# Patient Record
Sex: Female | Born: 1945 | Race: White | Hispanic: No | Marital: Married | Smoking: Current every day smoker
Health system: Southern US, Community
[De-identification: ages and names within clinical notes are randomized; demographics above are authoritative.]

## PROBLEM LIST (undated history)

## (undated) DIAGNOSIS — N390 Urinary tract infection, site not specified: Secondary | ICD-10-CM

## (undated) DIAGNOSIS — I219 Acute myocardial infarction, unspecified: Secondary | ICD-10-CM

## (undated) DIAGNOSIS — R32 Unspecified urinary incontinence: Secondary | ICD-10-CM

## (undated) HISTORY — DX: Unspecified urinary incontinence: R32

## (undated) HISTORY — DX: Urinary tract infection, site not specified: N39.0

## (undated) HISTORY — DX: Acute myocardial infarction, unspecified: I21.9

---

## 1983-06-30 HISTORY — PX: TUBAL LIGATION: SHX77

## 2010-02-26 ENCOUNTER — Inpatient Hospital Stay: Payer: Self-pay | Admitting: Internal Medicine

## 2010-06-29 DIAGNOSIS — I219 Acute myocardial infarction, unspecified: Secondary | ICD-10-CM

## 2010-06-29 HISTORY — DX: Acute myocardial infarction, unspecified: I21.9

## 2010-06-29 HISTORY — PX: CARDIAC CATHETERIZATION: SHX172

## 2013-03-08 ENCOUNTER — Ambulatory Visit (INDEPENDENT_AMBULATORY_CARE_PROVIDER_SITE_OTHER): Payer: Medicare Other | Admitting: Adult Health

## 2013-03-08 ENCOUNTER — Encounter: Payer: Self-pay | Admitting: Adult Health

## 2013-03-08 VITALS — BP 152/90 | HR 68 | Temp 98.3°F | Resp 12 | Ht 63.5 in | Wt 158.5 lb

## 2013-03-08 DIAGNOSIS — Z23 Encounter for immunization: Secondary | ICD-10-CM

## 2013-03-08 DIAGNOSIS — I10 Essential (primary) hypertension: Secondary | ICD-10-CM | POA: Insufficient documentation

## 2013-03-08 DIAGNOSIS — F17200 Nicotine dependence, unspecified, uncomplicated: Secondary | ICD-10-CM

## 2013-03-08 DIAGNOSIS — R32 Unspecified urinary incontinence: Secondary | ICD-10-CM

## 2013-03-08 DIAGNOSIS — Z7189 Other specified counseling: Secondary | ICD-10-CM

## 2013-03-08 DIAGNOSIS — Z716 Tobacco abuse counseling: Secondary | ICD-10-CM

## 2013-03-08 DIAGNOSIS — N390 Urinary tract infection, site not specified: Secondary | ICD-10-CM

## 2013-03-08 DIAGNOSIS — B373 Candidiasis of vulva and vagina: Secondary | ICD-10-CM

## 2013-03-08 LAB — POCT URINALYSIS DIPSTICK
Bilirubin, UA: NEGATIVE
Glucose, UA: NEGATIVE
Ketones, UA: NEGATIVE
Nitrite, UA: NEGATIVE
Protein, UA: NEGATIVE
Spec Grav, UA: 1.02
Urobilinogen, UA: 0.2
pH, UA: 6

## 2013-03-08 LAB — BASIC METABOLIC PANEL
BUN: 11 mg/dL (ref 6–23)
Chloride: 102 mEq/L (ref 96–112)
GFR: 78.63 mL/min (ref >60.00)
Potassium: 4.4 mEq/L (ref 3.5–5.1)

## 2013-03-08 MED ORDER — CIPROFLOXACIN HCL 250 MG PO TABS: 250.0000 mg | ORAL_TABLET | Freq: Two times a day (BID) | ORAL | Status: DC

## 2013-03-08 MED ORDER — LISINOPRIL-HYDROCHLOROTHIAZIDE 10-12.5 MG PO TABS: 1.0000 | ORAL_TABLET | Freq: Every day | ORAL | Status: DC

## 2013-03-08 MED ORDER — NYSTATIN-TRIAMCINOLONE 100000-0.1 UNIT/GM-% EX CREA: TOPICAL_CREAM | Freq: Two times a day (BID) | CUTANEOUS | Status: DC

## 2013-03-08 NOTE — Progress Notes (Signed)
Subjective:    Patient ID: Susan Cole, female    DOB: 1945/07/07, 67 y.o.   MRN: [id number]  HPI  Patient is a pleasant 67 y/o female who presents to clinic to establish care. She was previously followed by Dr. Marcello Fennel at [facility]. She last saw him approximately 2 years ago. She has a hx of HTN currently not taking any of her medications. She also has a hx of MI (2012) s/p hospitalization at [facility] and followed by Dr. Gwen Pounds. She has not seen him in approximately 2 years. Patient is an every day smoker. She reports she is having problems with urinary burning and incontinence which started ~ 3 weeks ago.   Past Medical History  Diagnosis Date  . Myocardial infarction 2012    [facility] seen by Dr. Gwen Pounds  . UTI (lower urinary tract infection)   . Urine incontinence      Past Surgical History  Procedure Laterality Date  . Tubal ligation  1985  . Cardiac catheterization  2012     Family History  Problem Relation Age of Onset  . Stroke Mother 42  . Hypertension Mother   . Diabetes Mother   . Goiter Mother   . Cancer Father 18    Lung CA  . Diabetes Sister   . Diabetes Brother   . Diabetes Brother   . Kidney disease Brother      History   Social History  . Marital Status: Married    Spouse Name: N/A    Number of Children: N/A  . Years of Education: N/A   Occupational History  . Not on file.   Social History Main Topics  . Smoking status: Current Every Day Smoker -- 0.50 packs/day for 35 years    Types: Cigarettes  . Smokeless tobacco: Never Used  . Alcohol Use: No  . Drug Use: No  . Sexual Activity: Yes    Birth Control/ Protection: Surgical   Other Topics Concern  . Not on file   Social History Narrative   Patient grew up in [place], [place]. She currently lives at home with her husband of 45 years. They have 1 son. She is retired and enjoys shopping and watching TV. She also loves animals.     Review of Systems  Constitutional: Negative.   HENT:  Negative.   Eyes: Negative.   Respiratory: Negative.   Cardiovascular: Positive for leg swelling.  Gastrointestinal: Negative.   Endocrine: Negative.   Genitourinary: Positive for dysuria, urgency, frequency and flank pain. Negative for hematuria and pelvic pain.       Vaginal itching  Musculoskeletal: Negative.   Skin: Negative.   Allergic/Immunologic: Negative.   Neurological: Negative.   Hematological: Negative.   Psychiatric/Behavioral: Negative.     BP 152/90  Pulse 68  Temp(Src) 98.3 F (36.8 C) (Oral)  Resp 12  Ht 5' 3.5" (1.613 m)  Wt 158 lb 8 oz (71.895 kg)  BMI 27.63 kg/m2  SpO2 97%    Objective:   Physical Exam  Constitutional: She is oriented to person, place, and time. She appears well-developed and well-nourished. No distress.  HENT:  Head: Normocephalic and atraumatic.  Right Ear: External ear normal.  Left Ear: External ear normal.  Mouth/Throat: Oropharynx is clear and moist.  Eyes: Conjunctivae and EOM are normal. Pupils are equal, round, and reactive to light.  Neck: Normal range of motion. Neck supple.  Cardiovascular: Normal rate, regular rhythm and normal heart sounds.   Pulmonary/Chest: Effort normal and breath sounds  normal. No respiratory distress. She has no wheezes.  Abdominal: Soft. Bowel sounds are normal.  Musculoskeletal: Normal range of motion. She exhibits edema.  Trace edema bilateral LE  Lymphadenopathy:    She has no cervical adenopathy.  Neurological: She is alert and oriented to person, place, and time.  Skin: Skin is warm and dry.  Psychiatric: She has a normal mood and affect. Her behavior is normal. Judgment and thought content normal.      Assessment & Plan:

## 2013-03-08 NOTE — Addendum Note (Signed)
Addended by: Chandra Batch E on: 03/08/2013 04:20 PM   Modules accepted: Orders

## 2013-03-08 NOTE — Assessment & Plan Note (Signed)
Flu vaccine given today. 

## 2013-03-08 NOTE — Patient Instructions (Addendum)
  Please take your blood pressure medication every morning (lisinopril/HCTZ 10mg /12.5mg ).  Take your blood pressure at least every 2 days and write it down. Check your blood pressure at least 2 hours after taking your blood pressure medication.  Start your Cipro 250 mg twice a day for 5 days. This is for urinary tract infection.  I have sent in a prescription for Mycolog cream. Apply to the affected areas that are itching twice a day.  Return for follow up in 2 weeks for high blood pressure and urinary symptoms.  Also schedule your Medicare Wellness Exam on a separate visit.

## 2013-03-08 NOTE — Assessment & Plan Note (Signed)
Discussed importance of smoking cessation especially with her history of cardiac disease. Patient reports that she was advised to quit when she had her MI in 2012; however, she is not ready to quit at this time. Continue to follow and encourage smoking cessation.

## 2013-03-08 NOTE — Assessment & Plan Note (Signed)
Urinary incontinence beginning approximately 3 weeks ago UA dipstick shows possible UTI. Send urine for culture. Start Cipro 250 mg twice a day x5 days. Return to clinic in 2 weeks for followup.

## 2013-03-08 NOTE — Assessment & Plan Note (Signed)
Patient has been hypertensive for some time. She reports being on lisinopril in the past; however, she has not taken any medication in approximately 2 years. Restart lisinopril 10 mg/HCTZ 12.5 mg. Instructed patient to monitor blood pressure at least every other day and record findings to bring with her at next visit. Check metabolic panel. Return in 2 weeks for followup.

## 2013-03-08 NOTE — Assessment & Plan Note (Signed)
Mycolog cream twice a day to affected area. Return to clinic in 2 weeks for followup

## 2013-03-09 LAB — URINE CULTURE
Colony Count: NO GROWTH
Organism ID, Bacteria: NO GROWTH

## 2013-03-10 ENCOUNTER — Encounter: Payer: Self-pay | Admitting: *Deleted

## 2013-03-10 ENCOUNTER — Other Ambulatory Visit: Payer: Self-pay | Admitting: Adult Health

## 2013-03-10 DIAGNOSIS — R3129 Other microscopic hematuria: Secondary | ICD-10-CM

## 2013-03-22 ENCOUNTER — Encounter: Payer: Self-pay | Admitting: Adult Health

## 2013-03-22 ENCOUNTER — Ambulatory Visit (INDEPENDENT_AMBULATORY_CARE_PROVIDER_SITE_OTHER): Payer: Medicare Other | Admitting: Adult Health

## 2013-03-22 VITALS — BP 118/76 | HR 88 | Temp 98.3°F | Resp 12 | Wt 160.5 lb

## 2013-03-22 DIAGNOSIS — N390 Urinary tract infection, site not specified: Secondary | ICD-10-CM

## 2013-03-22 DIAGNOSIS — R32 Unspecified urinary incontinence: Secondary | ICD-10-CM

## 2013-03-22 DIAGNOSIS — Z79899 Other long term (current) drug therapy: Secondary | ICD-10-CM

## 2013-03-22 DIAGNOSIS — B373 Candidiasis of vulva and vagina: Secondary | ICD-10-CM

## 2013-03-22 DIAGNOSIS — I10 Essential (primary) hypertension: Secondary | ICD-10-CM

## 2013-03-22 LAB — POCT URINALYSIS DIPSTICK
Blood, UA: NEGATIVE
Glucose, UA: NEGATIVE
Nitrite, UA: NEGATIVE
Protein, UA: NEGATIVE
Spec Grav, UA: 1.015
Urobilinogen, UA: 1

## 2013-03-22 NOTE — Assessment & Plan Note (Signed)
Taking lisinopril in the afternoon. Tolerating well. B/P well controlled. Check creatinine and potassium today.

## 2013-03-22 NOTE — Patient Instructions (Addendum)
  You are doing well.  Blood pressure has responded very well to medication.  Please continue your medications as you have been taking.  Please have your blood work done prior to leaving the office. I will notify you of results once available.

## 2013-03-22 NOTE — Assessment & Plan Note (Signed)
Treated for UTI. Completed course. No current symptoms reported.

## 2013-03-22 NOTE — Progress Notes (Signed)
  Subjective:    Patient ID: Susan Cole, female    DOB: 01-26-1946, 67 y.o.   MRN: [id number]  HPI  Pt is pleasant 67 yo female, presents to clinic for follow up. 2 weeks ago, pt treated for UTI, yeast infection, and HTN. Pt states she took the abx for UTI, HTN medication, and Nystatin cream as ordered.  Yeast infection completely resolved.  Pt denies urinary sx at this time.  Pt states she originally began taking her HTN medication in the morning but it was making her lightheaded on occasion so she changed it to 4 in the evening which she is tolerating much better.  Pt denies any concerns at this time.   Review of Systems  Constitutional: Negative.   HENT: Negative.   Eyes: Negative.   Respiratory: Negative.   Genitourinary: Negative.   Skin: Negative.   Psychiatric/Behavioral: Negative.        Objective:   Physical Exam  Constitutional: She is oriented to person, place, and time. She appears well-developed and well-nourished.  Eyes: Pupils are equal, round, and reactive to light.  Cardiovascular: Normal rate, regular rhythm, normal heart sounds and intact distal pulses.   Pulmonary/Chest: Effort normal. No respiratory distress. She has no wheezes. She has no rales.  Abdominal: Soft. Bowel sounds are normal.  Musculoskeletal: Normal range of motion.  Neurological: She is alert and oriented to person, place, and time.  Skin: Skin is warm and dry.  Psychiatric: She has a normal mood and affect. Her behavior is normal. Judgment and thought content normal.    BP 118/76  Pulse 88  Temp(Src) 98.3 F (36.8 C) (Oral)  Resp 12  Wt 160 lb 8 oz (72.802 kg)  BMI 27.98 kg/m2  SpO2 98%     Assessment & Plan:

## 2013-03-22 NOTE — Assessment & Plan Note (Signed)
Completely resolved. 

## 2013-03-24 LAB — URINE CULTURE
Colony Count: NO GROWTH
Organism ID, Bacteria: NO GROWTH

## 2013-03-28 ENCOUNTER — Other Ambulatory Visit (INDEPENDENT_AMBULATORY_CARE_PROVIDER_SITE_OTHER): Payer: Medicare Other

## 2013-03-28 ENCOUNTER — Telehealth: Payer: Self-pay | Admitting: *Deleted

## 2013-03-28 DIAGNOSIS — E875 Hyperkalemia: Secondary | ICD-10-CM

## 2013-03-28 NOTE — Telephone Encounter (Signed)
What labs and dx?  

## 2013-03-28 NOTE — Telephone Encounter (Signed)
Potassium. Hyperkalemia Did she come in for labs?

## 2013-03-29 NOTE — Telephone Encounter (Signed)
Yes, she came in on 09.30.2014

## 2013-04-19 ENCOUNTER — Ambulatory Visit (INDEPENDENT_AMBULATORY_CARE_PROVIDER_SITE_OTHER): Payer: Medicare Other | Admitting: Adult Health

## 2013-04-19 ENCOUNTER — Encounter: Payer: Self-pay | Admitting: Adult Health

## 2013-04-19 VITALS — BP 126/88 | HR 90 | Temp 98.0°F | Resp 12 | Wt 166.5 lb

## 2013-04-19 DIAGNOSIS — Z130 Encounter for screening for diseases of the blood and blood-forming organs and certain disorders involving the immune mechanism: Secondary | ICD-10-CM

## 2013-04-19 DIAGNOSIS — Z0000 Encounter for general adult medical examination without abnormal findings: Secondary | ICD-10-CM

## 2013-04-19 DIAGNOSIS — Z1321 Encounter for screening for nutritional disorder: Secondary | ICD-10-CM

## 2013-04-19 DIAGNOSIS — Z13820 Encounter for screening for osteoporosis: Secondary | ICD-10-CM

## 2013-04-19 DIAGNOSIS — Z1239 Encounter for other screening for malignant neoplasm of breast: Secondary | ICD-10-CM

## 2013-04-19 NOTE — Progress Notes (Signed)
Subjective:    Patient ID: Susan Cole, female    DOB: Aug 30, 1945, 67 y.o.   MRN: [id number]  HPI  The patient is here for annual Medicare wellness examination and management of other chronic and acute problems.   The risk factors are reflected in the social history.  The roster of all physicians providing medical care to patient is listed in the Snapshot section of the chart.  Activities of daily living:  The patient is 100% independent in all ADLs: dressing, toileting, bathing, feeding as well as independent mobility.  Instrumental Activities of daily living: The patient is 100% independent in all iADLs: cooking, driving, keeping track of finances, managing medications, shopping, using telephone and computer.  Home safety: The patient has smoke detectors in the home. Seatbelts are worn 100%.  There are no firearms at home. There is no violence in the home. No hx of IPV.  There is no risks for hepatitis, STDs or HIV. There is no history of blood transfusion. No travel history to infectious disease endemic areas of the world.  The patient has seen dentist in the last six month. Pt will be seeing the eye doctor the first of the year. No hearing impairment. She has deferred audiologic testing.  No excessive sun exposure. Discussed the need for sun protection: hats, long sleeves and use of sunscreen if there is significant sun exposure.   Diet: the importance of a healthy diet is discussed. Pt needs to follows a healthy diet. She is not always able to affords healthier options.  The benefits of regular aerobic exercise were discussed. Discussed walking briskly as a good way to start.  Depression screen: there are no signs or vegative symptoms of depression- irritability, change in appetite, anhedonia, sadness/tearfullness.  Cognitive assessment: the patient manages all their financial and personal affairs and is actively engaged. Able to relate day,date,year and events; recalled 2/3  objects at 3 minutes; performed clock-face test normally.  The following portions of the patient's history were reviewed and updated as appropriate: allergies, current medications, past family history, past medical history,  past surgical history, past social history  and problem list.  Visual acuity was not assessed per patient preference since has regular follow up with ophthalmologist. Hearing and body mass index were assessed and reviewed.   During the course of the visit the patient was educated and counseled about appropriate screening and preventive services including : fall prevention , diabetes screening, nutrition counseling, colorectal cancer screening, and recommended immunizations.       Past Medical History  Diagnosis Date  . Myocardial infarction 2012    [facility] seen by Dr. Gwen Pounds  . UTI (lower urinary tract infection)   . Urine incontinence      Past Surgical History  Procedure Laterality Date  . Tubal ligation  1985  . Cardiac catheterization  2012     Family History  Problem Relation Age of Onset  . Stroke Mother 36  . Hypertension Mother   . Diabetes Mother   . Goiter Mother   . Cancer Father 79    Lung CA  . Diabetes Sister   . Diabetes Brother   . Diabetes Brother   . Kidney disease Brother      History   Social History  . Marital Status: Married    Spouse Name: N/A    Number of Children: N/A  . Years of Education: N/A   Occupational History  . Not on file.   Social History Main Topics  .  Smoking status: Current Every Day Smoker -- 0.50 packs/day for 35 years    Types: Cigarettes  . Smokeless tobacco: Never Used  . Alcohol Use: No  . Drug Use: No  . Sexual Activity: Yes    Birth Control/ Protection: Surgical   Other Topics Concern  . Not on file   Social History Narrative   Patient grew up in [place], [place]. She currently lives at home with her husband of 45 years. They have 1 son. She is retired and enjoys shopping and watching TV.  She also loves animals.      Review of Systems  Constitutional: Negative.   HENT: Negative.   Eyes: Negative.   Respiratory: Negative.   Cardiovascular: Negative.   Gastrointestinal: Negative.   Endocrine: Negative.   Genitourinary: Negative for dysuria, frequency, hematuria, vaginal bleeding, vaginal discharge and vaginal pain.       Mild incontinence  Musculoskeletal: Negative.   Skin: Negative.   Allergic/Immunologic: Negative.   Neurological: Negative.   Hematological: Negative.   Psychiatric/Behavioral: Negative.        Objective:   Physical Exam  Constitutional: She is oriented to person, place, and time. She appears well-developed and well-nourished. No distress.  HENT:  Head: Normocephalic and atraumatic.  Right Ear: External ear normal.  Left Ear: External ear normal.  Nose: Nose normal.  Mouth/Throat: Oropharynx is clear and moist.  Poor dentition  Eyes: Conjunctivae and EOM are normal. Pupils are equal, round, and reactive to light.  Neck: Normal range of motion. Neck supple. No tracheal deviation present. No thyromegaly present.  Cardiovascular: Normal rate, regular rhythm, normal heart sounds and intact distal pulses.  Exam reveals no gallop and no friction rub.   No murmur heard. Pulmonary/Chest: Effort normal and breath sounds normal. No respiratory distress. She has no wheezes. She has no rales.  Abdominal: Soft. Bowel sounds are normal. She exhibits no distension and no mass. There is no tenderness. There is no rebound and no guarding. Hernia confirmed negative in the right inguinal area and confirmed negative in the left inguinal area.  Genitourinary: Vagina normal and uterus normal. No breast swelling, tenderness, discharge or bleeding. There is no rash, tenderness, lesion or injury on the right labia. There is no rash, tenderness, lesion or injury on the left labia. Cervix exhibits no motion tenderness, no discharge and no friability. Right adnexum displays  no mass, no tenderness and no fullness. Left adnexum displays no mass, no tenderness and no fullness. No vaginal discharge found.  Breast exam normal without any palpable masses, inverted nipples, rashes. PAP exam done.  Musculoskeletal: Normal range of motion. She exhibits no edema and no tenderness.  Lymphadenopathy:    She has no cervical adenopathy.       Right: No inguinal adenopathy present.       Left: No inguinal adenopathy present.  Neurological: She is alert and oriented to person, place, and time. She has normal reflexes. No cranial nerve deficit. Coordination normal.  Skin: Skin is warm and dry.  Psychiatric: She has a normal mood and affect. Her behavior is normal. Judgment and thought content normal.          Assessment & Plan:

## 2013-04-19 NOTE — Assessment & Plan Note (Signed)
Annual comprehensive exam was done including breast, pelvic exam/PAP. All screenings have been addressed.

## 2013-04-19 NOTE — Patient Instructions (Signed)
  Today you had your Medicare wellness exam.  Please return for blood work. Do not eat or drink anything prior to having the blood work. You may drink water.  I am scheduling you a mammogram and a bone density exam.  You are due for your colonoscopy but you prefer to have this done next year.

## 2013-04-20 ENCOUNTER — Other Ambulatory Visit (HOSPITAL_COMMUNITY)
Admission: RE | Admit: 2013-04-20 | Discharge: 2013-04-20 | Disposition: A | Discharge status: Home / Self Care | Payer: Medicare Other | Source: Ambulatory Visit | Attending: Adult Health | Admitting: Adult Health

## 2013-04-20 DIAGNOSIS — Z1151 Encounter for screening for human papillomavirus (HPV): Secondary | ICD-10-CM | POA: Insufficient documentation

## 2013-04-20 DIAGNOSIS — Z01419 Encounter for gynecological examination (general) (routine) without abnormal findings: Secondary | ICD-10-CM | POA: Insufficient documentation

## 2013-04-20 NOTE — Addendum Note (Signed)
Addended by: Montine Circle D on: 04/20/2013 10:59 AM   Modules accepted: Orders

## 2013-04-24 ENCOUNTER — Encounter: Payer: Self-pay | Admitting: *Deleted

## 2013-04-28 ENCOUNTER — Other Ambulatory Visit (INDEPENDENT_AMBULATORY_CARE_PROVIDER_SITE_OTHER): Payer: Medicare Other

## 2013-04-28 DIAGNOSIS — Z0000 Encounter for general adult medical examination without abnormal findings: Secondary | ICD-10-CM

## 2013-04-28 DIAGNOSIS — Z1321 Encounter for screening for nutritional disorder: Secondary | ICD-10-CM

## 2013-04-28 DIAGNOSIS — Z13820 Encounter for screening for osteoporosis: Secondary | ICD-10-CM

## 2013-04-28 LAB — CBC WITH DIFFERENTIAL/PLATELET
Basophils Absolute: 0 10*3/uL (ref 0.0–0.1)
Basophils Relative: 0.5 % (ref 0.0–3.0)
Eosinophils Absolute: 0.2 10*3/uL (ref 0.0–0.7)
HCT: 41.7 % (ref 36.0–46.0)
Hemoglobin: 14.2 g/dL (ref 12.0–15.0)
Lymphocytes Relative: 23.1 % (ref 12.0–46.0)
Lymphs Abs: 2 10*3/uL (ref 0.7–4.0)
MCHC: 34 g/dL (ref 30.0–36.0)
MCV: 89.9 fl (ref 78.0–100.0)
Neutro Abs: 5.8 10*3/uL (ref 1.4–7.7)
RBC: 4.64 Mil/uL (ref 3.87–5.11)
RDW: 13.5 % (ref 11.5–14.6)

## 2013-04-28 LAB — LIPID PANEL: HDL: 49.4 mg/dL (ref >39.00)

## 2013-04-28 LAB — HEPATIC FUNCTION PANEL
ALT: 11 U/L (ref 0–35)
AST: 15 U/L (ref 0–37)
Bilirubin, Direct: 0 mg/dL (ref 0.0–0.3)
Total Bilirubin: 0.7 mg/dL (ref 0.3–1.2)

## 2013-04-28 LAB — LDL CHOLESTEROL, DIRECT: Direct LDL: 170.7 mg/dL

## 2013-04-28 LAB — TSH: TSH: 1.26 u[IU]/mL (ref 0.35–5.50)

## 2013-04-29 ENCOUNTER — Other Ambulatory Visit: Payer: Self-pay | Admitting: Adult Health

## 2013-04-29 MED ORDER — ERGOCALCIFEROL 1.25 MG (50000 UT) PO CAPS: 50000.0000 [IU] | ORAL_CAPSULE | ORAL | Status: DC

## 2013-04-29 NOTE — Progress Notes (Signed)
Vitamin D level 11 - start Drisdol 50,000 units weekly x 12 weeks, then repeat vit d level Prescription sent to pharmacy

## 2013-05-01 ENCOUNTER — Encounter: Payer: Self-pay | Admitting: *Deleted

## 2013-05-01 NOTE — Progress Notes (Signed)
Called and advised patient of results. Mailed copy of labs and notes as well.

## 2015-01-01 ENCOUNTER — Ambulatory Visit (INDEPENDENT_AMBULATORY_CARE_PROVIDER_SITE_OTHER): Payer: Medicare Other | Admitting: Nurse Practitioner

## 2015-01-01 VITALS — BP 142/90 | HR 86 | Temp 98.0°F | Resp 16 | Ht 64.0 in | Wt 176.8 lb

## 2015-01-01 DIAGNOSIS — M25561 Pain in right knee: Secondary | ICD-10-CM | POA: Diagnosis not present

## 2015-01-01 DIAGNOSIS — I10 Essential (primary) hypertension: Secondary | ICD-10-CM

## 2015-01-01 LAB — CBC WITH DIFFERENTIAL/PLATELET
BASOS ABS: 0.1 10*3/uL (ref 0.0–0.1)
Basophils Relative: 0.6 % (ref 0.0–3.0)
EOS ABS: 0.1 10*3/uL (ref 0.0–0.7)
EOS PCT: 1.3 % (ref 0.0–5.0)
HCT: 43.7 % (ref 36.0–46.0)
Hemoglobin: 14.3 g/dL (ref 12.0–15.0)
Lymphocytes Relative: 22.9 % (ref 12.0–46.0)
Lymphs Abs: 2 10*3/uL (ref 0.7–4.0)
MCHC: 32.7 g/dL (ref 30.0–36.0)
MCV: 90.1 fl (ref 78.0–100.0)
Monocytes Absolute: 0.6 10*3/uL (ref 0.1–1.0)
Monocytes Relative: 7 % (ref 3.0–12.0)
NEUTROS PCT: 68.2 % (ref 43.0–77.0)
Neutro Abs: 6 10*3/uL (ref 1.4–7.7)
Platelets: 281 10*3/uL (ref 150.0–400.0)
RBC: 4.85 Mil/uL (ref 3.87–5.11)
RDW: 13.8 % (ref 11.5–15.5)
WBC: 8.8 10*3/uL (ref 4.0–10.5)

## 2015-01-01 LAB — BASIC METABOLIC PANEL
BUN: 16 mg/dL (ref 6–23)
CALCIUM: 10 mg/dL (ref 8.4–10.5)
CO2: 29 meq/L (ref 19–32)
CREATININE: 0.87 mg/dL (ref 0.40–1.20)
Chloride: 103 mEq/L (ref 96–112)
GFR: 68.93 mL/min (ref >60.00)
Glucose, Bld: 103 mg/dL — ABNORMAL HIGH (ref 70–99)
Potassium: 4.7 mEq/L (ref 3.5–5.1)
Sodium: 140 mEq/L (ref 135–145)

## 2015-01-01 MED ORDER — OMEPRAZOLE 40 MG PO CPDR: 40.0000 mg | DELAYED_RELEASE_CAPSULE | Freq: Every day | ORAL | Status: DC

## 2015-01-01 MED ORDER — MELOXICAM 15 MG PO TABS: 15.0000 mg | ORAL_TABLET | Freq: Every day | ORAL | Status: DC

## 2015-01-01 MED ORDER — LISINOPRIL-HYDROCHLOROTHIAZIDE 10-12.5 MG PO TABS: 1.0000 | ORAL_TABLET | Freq: Every day | ORAL | Status: DC

## 2015-01-01 NOTE — Progress Notes (Signed)
   Subjective:    Patient ID: Susan Cole, female    DOB: 08-Oct-1945, 69 y.o.   MRN: [id number]  HPI  Susan Cole is a 69 yo female with a CC of right knee pain x 2-3 months.   1) Worse in the morning, takes generic Aleve daily x 1 month, worse in the morning Pain daily, improves after medication and as the day goes on Cramps after sitting, changes positions frequently. Stabbing, worst 8/10, best 4/10    1 month of aleve,  Knee brace  Aleve Bengay- helpful somewhat   Review of Systems  Constitutional: Negative for fever, chills, diaphoresis and fatigue.  Respiratory: Negative for chest tightness, shortness of breath and wheezing.   Cardiovascular: Negative for chest pain, palpitations and leg swelling.  Gastrointestinal: Negative for nausea, vomiting and diarrhea.  Skin: Negative for rash.  Neurological: Negative for dizziness, weakness, numbness and headaches.  Psychiatric/Behavioral: The patient is not nervous/anxious.       Objective:   Physical Exam  Constitutional: She is oriented to person, place, and time. She appears well-developed and well-nourished. No distress.  BP 142/90 mmHg  Pulse 86  Temp(Src) 98 F (36.7 C)  Resp 16  Ht 5\' 4"  (1.626 m)  Wt 176 lb 12.8 oz (80.196 kg)  BMI 30.33 kg/m2  SpO2 94%   HENT:  Head: Normocephalic and atraumatic.  Right Ear: External ear normal.  Left Ear: External ear normal.  Cardiovascular: Normal rate, regular rhythm, normal heart sounds and intact distal pulses.  Exam reveals no gallop and no friction rub.   No murmur heard. Pulmonary/Chest: Effort normal and breath sounds normal. No respiratory distress. She has no wheezes. She has no rales. She exhibits no tenderness.  Musculoskeletal: Normal range of motion. She exhibits tenderness. She exhibits no edema.  Knees do not exhibit laxity, no effusion on exam, slight tenderness with general palpation  Neurological: She is alert and oriented to person, place, and time. No  cranial nerve deficit. She exhibits normal muscle tone. Coordination normal.  Skin: Skin is warm and dry. No rash noted. She is not diaphoretic.  Psychiatric: She has a normal mood and affect. Her behavior is normal. Judgment and thought content normal.      Assessment & Plan:

## 2015-01-01 NOTE — Progress Notes (Signed)
Pre visit review using our clinic review tool, if applicable. No additional management support is needed unless otherwise documented below in the visit note. 

## 2015-01-01 NOTE — Patient Instructions (Addendum)
Please take the omeprazole to protect your stomach  Meloxicam 15 mg daily for the knee pain. Avoid the generic aleve while on this, if you need other pain help- use tylenol.   Jule SerBengay is okay to use.   Follow up in 1 month.

## 2015-01-12 ENCOUNTER — Encounter: Payer: Self-pay | Admitting: Nurse Practitioner

## 2015-01-12 DIAGNOSIS — M25561 Pain in right knee: Secondary | ICD-10-CM | POA: Insufficient documentation

## 2015-01-12 DIAGNOSIS — I10 Essential (primary) hypertension: Secondary | ICD-10-CM | POA: Insufficient documentation

## 2015-01-12 NOTE — Assessment & Plan Note (Signed)
Pt not controlled. Did not take BP medication yet she reports. Will obtain BMET and CBC w/ diff today since it has been awhile. Will follow.

## 2015-01-12 NOTE — Assessment & Plan Note (Signed)
Worsening, RICE, meloxicam- no other NSAIDs with this and take Omeprazole to protect the stomach. FU in 1 month.

## 2015-02-05 ENCOUNTER — Ambulatory Visit: Payer: Medicare Other | Admitting: Nurse Practitioner

## 2016-04-30 ENCOUNTER — Telehealth: Payer: Self-pay | Admitting: Nurse Practitioner

## 2016-04-30 NOTE — Telephone Encounter (Signed)
I called pt and left a vm to call office to sch AWV. Thank you! °

## 2016-05-27 ENCOUNTER — Telehealth: Payer: Self-pay | Admitting: Nurse Practitioner

## 2016-05-27 NOTE — Telephone Encounter (Signed)
Pt states she will call back to schedule a AWV. Thank you!

## 2016-08-28 ENCOUNTER — Ambulatory Visit: Payer: Medicare Other

## 2016-09-23 ENCOUNTER — Ambulatory Visit: Payer: Medicare Other

## 2016-12-02 ENCOUNTER — Ambulatory Visit: Payer: Medicare Other | Admitting: Family

## 2019-02-07 ENCOUNTER — Telehealth: Payer: Self-pay | Admitting: Family

## 2019-02-07 NOTE — Telephone Encounter (Signed)
Copied from [place] [id number]. Topic: Appointment Scheduling - Scheduling Inquiry for Clinic >> Feb 06, 2019  2:30 PM [name] wrote: Reason for CRM: Pt would like to re-estabilish with a new provider at the office. Last seen in 2016 by Lorane Gell, NP.  Pt is also due for AWV with Denisa at anytime. Ok to schedule

## 2019-08-01 ENCOUNTER — Ambulatory Visit: Payer: Medicare Other | Admitting: Family Medicine

## 2019-08-02 ENCOUNTER — Encounter: Payer: Medicare Other | Admitting: Family

## 2019-08-02 ENCOUNTER — Ambulatory Visit: Payer: Medicare Other | Admitting: Family

## 2019-08-07 ENCOUNTER — Encounter: Payer: Self-pay | Admitting: Family

## 2019-08-07 ENCOUNTER — Other Ambulatory Visit: Payer: Self-pay

## 2019-08-07 ENCOUNTER — Ambulatory Visit (INDEPENDENT_AMBULATORY_CARE_PROVIDER_SITE_OTHER): Payer: Medicare Other | Admitting: Family

## 2019-08-07 ENCOUNTER — Ambulatory Visit (INDEPENDENT_AMBULATORY_CARE_PROVIDER_SITE_OTHER): Payer: Medicare Other

## 2019-08-07 VITALS — BP 162/96 | HR 92 | Temp 98.1°F | Ht 63.0 in | Wt 175.8 lb

## 2019-08-07 DIAGNOSIS — Z7689 Persons encountering health services in other specified circumstances: Secondary | ICD-10-CM | POA: Insufficient documentation

## 2019-08-07 DIAGNOSIS — Z13220 Encounter for screening for lipoid disorders: Secondary | ICD-10-CM

## 2019-08-07 DIAGNOSIS — R079 Chest pain, unspecified: Secondary | ICD-10-CM

## 2019-08-07 DIAGNOSIS — I10 Essential (primary) hypertension: Secondary | ICD-10-CM | POA: Diagnosis not present

## 2019-08-07 DIAGNOSIS — Z1159 Encounter for screening for other viral diseases: Secondary | ICD-10-CM | POA: Diagnosis not present

## 2019-08-07 DIAGNOSIS — F17210 Nicotine dependence, cigarettes, uncomplicated: Secondary | ICD-10-CM

## 2019-08-07 DIAGNOSIS — Z1211 Encounter for screening for malignant neoplasm of colon: Secondary | ICD-10-CM

## 2019-08-07 DIAGNOSIS — Z23 Encounter for immunization: Secondary | ICD-10-CM

## 2019-08-07 DIAGNOSIS — Z136 Encounter for screening for cardiovascular disorders: Secondary | ICD-10-CM

## 2019-08-07 DIAGNOSIS — Z716 Tobacco abuse counseling: Secondary | ICD-10-CM

## 2019-08-07 DIAGNOSIS — Z1231 Encounter for screening mammogram for malignant neoplasm of breast: Secondary | ICD-10-CM

## 2019-08-07 DIAGNOSIS — Z122 Encounter for screening for malignant neoplasm of respiratory organs: Secondary | ICD-10-CM

## 2019-08-07 LAB — CBC WITH DIFFERENTIAL/PLATELET
Basophils Absolute: 0.1 10*3/uL (ref 0.0–0.1)
Basophils Relative: 0.8 % (ref 0.0–3.0)
Eosinophils Absolute: 0.1 10*3/uL (ref 0.0–0.7)
Eosinophils Relative: 2 % (ref 0.0–5.0)
HCT: 42 % (ref 36.0–46.0)
Hemoglobin: 13.9 g/dL (ref 12.0–15.0)
Lymphocytes Relative: 22.2 % (ref 12.0–46.0)
Lymphs Abs: 1.5 10*3/uL (ref 0.7–4.0)
MCHC: 33.1 g/dL (ref 30.0–36.0)
MCV: 90.6 fl (ref 78.0–100.0)
Monocytes Absolute: 0.5 10*3/uL (ref 0.1–1.0)
Monocytes Relative: 7.6 % (ref 3.0–12.0)
Neutro Abs: 4.6 10*3/uL (ref 1.4–7.7)
Neutrophils Relative %: 67.4 % (ref 43.0–77.0)
Platelets: 253 10*3/uL (ref 150.0–400.0)
RBC: 4.64 Mil/uL (ref 3.87–5.11)
RDW: 13.7 % (ref 11.5–15.5)
WBC: 6.9 10*3/uL (ref 4.0–10.5)

## 2019-08-07 LAB — LIPID PANEL
Cholesterol: 179 mg/dL (ref 0–200)
HDL: 51 mg/dL (ref >39.00)
LDL Cholesterol: 107 mg/dL — ABNORMAL HIGH (ref 0–99)
NonHDL: 127.57
Total CHOL/HDL Ratio: 4
Triglycerides: 105 mg/dL (ref 0.0–149.0)
VLDL: 21 mg/dL (ref 0.0–40.0)

## 2019-08-07 LAB — COMPREHENSIVE METABOLIC PANEL
ALT: 12 U/L (ref 0–35)
AST: 16 U/L (ref 0–37)
Albumin: 4.1 g/dL (ref 3.5–5.2)
Alkaline Phosphatase: 69 U/L (ref 39–117)
BUN: 12 mg/dL (ref 6–23)
CO2: 29 mEq/L (ref 19–32)
Calcium: 9.5 mg/dL (ref 8.4–10.5)
Chloride: 103 mEq/L (ref 96–112)
Creatinine, Ser: 0.89 mg/dL (ref 0.40–1.20)
GFR: 62.34 mL/min (ref >60.00)
Glucose, Bld: 102 mg/dL — ABNORMAL HIGH (ref 70–99)
Potassium: 4.2 mEq/L (ref 3.5–5.1)
Sodium: 140 mEq/L (ref 135–145)
Total Bilirubin: 0.3 mg/dL (ref 0.2–1.2)
Total Protein: 7.2 g/dL (ref 6.0–8.3)

## 2019-08-07 MED ORDER — LOSARTAN POTASSIUM 50 MG PO TABS: 50.0000 mg | ORAL_TABLET | Freq: Every day | ORAL | 0 refills | Status: DC

## 2019-08-07 NOTE — Progress Notes (Addendum)
Subjective:    Patient ID: Susan Cole, female    DOB: Mar 17, 1946, 74 y.o.   MRN: [id number]  CC: Susan Cole is a 74 y.o. female who presents today to establish care.    HPI: Establish care. She complains about feeling "swimmy headed" which has been going on for 1 year.  She states episodes occur about once a month and has been unchanged.  She notes feeling dizzy when she changes position. She denies loss of consciousness, syncope, left arm numbness.  She also complains of left sided 'grabbing' chest pain . Occurs 'when she is stressed'.  Last episode occurred several nights ago.  This is been going on for the past several months, unchanged.  The pain may be absent for a week or 2 weeks and then recur.   Does have history of reflux and complaint of epigastric burning.  During his episode she denies left arm pain, diaphoresis, nausea, vomiting, shortness of breath or cough.  The pain lasted for a few minutes and then it fades out.  She is no longer taking aspirin 81 mg.  She does take 2 ibuprofen daily for "aches and pains".  She is a history of a heart attack 7 years ago, was seen by Dr. Rhae Lerner at this time. She also complains of bilateral leg swelling which is intermittent.  She states she sleeps in a recliner.   HISTORY:  Past Medical History:  Diagnosis Date  . Myocardial infarction [facility]) 2012   [facility] seen by Dr. Gwen Pounds  . Urine incontinence   . UTI (lower urinary tract infection)    Past Surgical History:  Procedure Laterality Date  . CARDIAC CATHETERIZATION  2012  . TUBAL LIGATION  1985   Family History  Problem Relation Age of Onset  . Stroke Mother 41  . Hypertension Mother   . Diabetes Mother   . Goiter Mother   . Cancer Father 53       Lung CA  . Diabetes Sister   . Diabetes Brother   . Diabetes Brother   . Kidney disease Brother     Allergies: Patient has no known allergies. No current outpatient medications on file prior to visit.   No current  facility-administered medications on file prior to visit.    Social History   Tobacco Use  . Smoking status: Current Every Day Smoker    Packs/day: 0.50    Years: 35.00    Pack years: 17.50    Types: Cigarettes  . Smokeless tobacco: Never Used  Substance Use Topics  . Alcohol use: No    Alcohol/week: 0.0 standard drinks  . Drug use: No    Review of Systems  Constitutional: Negative for chills and fever.  Eyes: Negative for visual disturbance.  Respiratory: Negative for cough and shortness of breath.   Cardiovascular: Positive for chest pain and leg swelling. Negative for palpitations.  Gastrointestinal: Negative for nausea and vomiting.  Neurological: Positive for dizziness. Negative for numbness and headaches.      Objective:    BP (!) 162/96   Pulse 92   Temp 98.1 F (36.7 C) (Skin)   Ht 5\' 3"  (1.6 m)   Wt 175 lb 12.8 oz (79.7 kg)   SpO2 97%   BMI 31.14 kg/m  BP Readings from Last 3 Encounters:  08/07/19 (!) 162/96  01/01/15 (!) 142/90  04/19/13 126/88   Wt Readings from Last 3 Encounters:  08/07/19 175 lb 12.8 oz (79.7 kg)  01/01/15 176 lb  12.8 oz (80.2 kg)  04/19/13 166 lb 8 oz (75.5 kg)    Physical Exam Vitals reviewed.  Constitutional:      Appearance: She is well-developed.  Eyes:     Conjunctiva/sclera: Conjunctivae normal.  Cardiovascular:     Rate and Rhythm: Normal rate and regular rhythm.     Pulses: Normal pulses.     Heart sounds: Normal heart sounds.     Comments: No appreciable edema bilaterally.  Skin is very dry, flaking. Pulmonary:     Effort: Pulmonary effort is normal.     Breath sounds: Normal breath sounds. No wheezing, rhonchi or rales.  Chest:     Chest wall: No tenderness.  Musculoskeletal:     Right lower leg: No edema.     Left lower leg: No edema.  Skin:    General: Skin is warm and dry.  Neurological:     Mental Status: She is alert.  Psychiatric:        Speech: Speech normal.        Behavior: Behavior normal.         Thought Content: Thought content normal.        Assessment & Plan:   Problem List Items Addressed This Visit      Cardiovascular and Mediastinum   HTN (hypertension)    Uncontrolled. Some degree of orthostatic hypotension ( see flow sheet). Advised caution with position changes. Start losartan 50mg  and repeat BMP one week. Close f/u in 2 weeks.       Relevant Medications   losartan (COZAAR) 50 MG tablet   Other Relevant Orders   Comprehensive metabolic panel   Lipid panel   DG Chest 2 View   Basic metabolic panel     Other   Chest pain - Primary    Unable to find notes from Dr. in [place] careeverywhere. Based on patient's H/o MI, she is off her antihypertensive medications, current smoker and symptoms, I have great concern that chest pain could be cardiac in origin.  No reproducible chest wall tenderness .patient has multiple cardiovascular risk factors which is why I placed referral to cardiology urgently.  EKG reviewed by myself showed NSR, no significant changes when compared to prior in 2011.       Relevant Orders   CBC with Differential/Platelet   Ambulatory referral to Cardiology   Encounter to establish care    Patient has been lost to follow-up, and off all medications today.  She is establishing care today.  I have ordered what labs, imaging Medicare would allow.  We will have close follow-up as I discussed with patient and to get her up-to-date on preventative care.  Of note, she is not fasting today however out of concern for not coming back for another lab visit , I went ahead and ordered lipid panel.      Tobacco abuse counseling    Ordered with correct Dx code for billing.       Other Visit Diagnoses    Need for immunization against influenza       Relevant Orders   Flu Vaccine QUAD High Dose(Fluad) (Completed)   Encounter for lipid screening for cardiovascular disease       Relevant Orders   Comprehensive metabolic panel   Lipid panel    Encounter for hepatitis C screening test for low risk patient       Relevant Orders   Hepatitis C antibody   Encounter for screening mammogram for malignant neoplasm of breast  Relevant Orders   MM 3D SCREEN BREAST BILATERAL   Screen for colon cancer       Relevant Orders   Ambulatory referral to Gastroenterology   Encounter for screening for lung cancer       Cigarette smoker       Relevant Orders   CT CHEST LUNG CA SCREEN LOW DOSE W/O CM       I have discontinued Enid Derry P. Bartelt's aspirin, nystatin-triamcinolone, ergocalciferol, lisinopril-hydrochlorothiazide, meloxicam, and omeprazole. I am also having her start on losartan.   Meds ordered this encounter  Medications  . losartan (COZAAR) 50 MG tablet    Sig: Take 1 tablet (50 mg total) by mouth daily.    Dispense:  90 tablet    Refill:  0    Order Specific Question:   Supervising Provider    Answer:   Crecencio Mc [[id number]]    Return precautions given.   Risks, benefits, and alternatives of the medications and treatment plan prescribed today were discussed, and patient expressed understanding.   Education regarding symptom management and diagnosis given to patient on AVS.  Continue to follow with Burnard Hawthorne, FNP for routine health maintenance.   Nadyne Coombes and I agreed with plan.   Mable Paris, FNP

## 2019-08-07 NOTE — Assessment & Plan Note (Addendum)
Ordered with correct Dx code for billing.

## 2019-08-07 NOTE — Addendum Note (Signed)
Addended by: Allegra Grana on: 08/07/2019 12:55 PM   Modules accepted: Orders, Level of Service

## 2019-08-07 NOTE — Patient Instructions (Addendum)
I would like you to start losartan 50 mg. You will need labs again in ONE week after starting this medication.    You may take this medication in the evening.  Please be careful when taking positions as you think is contributory to your feeling dizzy.  However your blood pressure is elevated, we must lower this to reduce stroke and heart attack.  If you experience any chest pain, you must go to the emergency room because you have a history of a heart attack.  In the interim, we are working on getting you urgently seen by cardiologist.  Again, if you have chest pain prior to seeing cardiology, you will have to go to emergency room.  Please follow-up with our office, I like to see you back in 2 weeks.    Heart Attack A heart attack occurs when blood and oxygen supply to the heart is cut off. A heart attack causes damage to the heart that cannot be fixed. A heart attack is also called a myocardial infarction, or MI. If you think you are having a heart attack, do not wait to see if the symptoms will go away. Get medical help right away. What are the causes? This condition may be caused by:  A fatty substance (plaque) in the blood vessels (arteries). This can block the flow of blood to the heart.  A blood clot in the blood vessels that go to the heart. The blood clot blocks blood flow.  Low blood pressure.  An abnormal heartbeat.  Some diseases, such as problems in red blood cells (anemia)orproblems in breathing (respiratory failure).  Tightening (spasm) of a blood vessel that cuts off blood to the heart.  A tear in a blood vessel of the heart.  High blood pressure. What increases the risk? The following factors may make you more likely to develop this condition:  Aging. The older you are, the higher your risk.  Having a personal or family history of chest pain, heart attack, stroke, or narrowing of the arteries in the legs, arms, head, or stomach (peripheral artery disease).  Being  female.  Smoking.  Not getting regular exercise.  Being overweight or obese.  Having high blood pressure.  Having high cholesterol.  Having diabetes.  Drinking too much alcohol.  Using illegal drugs, such as cocaine or methamphetamine. What are the signs or symptoms? Symptoms of this condition include:  Chest pain. It may feel like: ? Crushing or squeezing. ? Tightness, pressure, fullness, or heaviness.  Pain in the arm, neck, jaw, back, or upper body.  Shortness of breath.  Heartburn.  Upset stomach (indigestion).  Feeling like you may vomit (nauseous).  Cold sweats.  Feeling tired.  Sudden light-headedness. How is this treated? A heart attack must be treated as soon as possible. Treatment may include:  Medicines to: ? Break up or dissolve blood clots. ? Thin blood and help prevent blood clots. ? Treat blood pressure. ? Improve blood flow to the heart. ? Reduce pain. ? Reduce cholesterol.  Procedures to widen a blocked artery and keep it open.  Open heart surgery.  Receiving oxygen.  Making your heart strong again (cardiac rehabilitation) through exercise, education, and counseling. Follow these instructions at home: Medicines  Take over-the-counter and prescription medicines only as told by your doctor. You may need to take medicine: ? To keep your blood from clotting too easily. ? To control blood pressure. ? To lower cholesterol. ? To control heart rhythms.  Do not take these  medicines unless your doctor says it is okay: ? NSAIDs, such as ibuprofen. ? Supplements that have vitamin A, vitamin E, or both. ? Hormone replacement therapy that has estrogen with or without progestin. Lifestyle      Do not use any products that have nicotine or tobacco, such as cigarettes, e-cigarettes, and chewing tobacco. If you need help quitting, ask your doctor.  Avoid secondhand smoke.  Exercise regularly. Ask your doctor about a cardiac rehab  program.  Eat heart-healthy foods. Your doctor will tell you what foods to eat.  Stay at a healthy weight.  Lower your stress level.  Do not use illegal drugs. Alcohol use  Do not drink alcohol if: ? Your doctor tells you not to drink. ? You are pregnant, may be pregnant, or are planning to become pregnant.  If you drink alcohol: ? Limit how much you use to:  0-1 drink a day for women.  0-2 drinks a day for men. ? Know how much alcohol is in your drink. In the [place] one drink equals one 12 oz bottle of beer (355 mL), one 5 oz glass of wine (148 mL), or one 1 oz glass of hard liquor (44 mL). General instructions  Work with your doctor to treat other problems you may have, such as diabetes or high blood pressure.  Get screened for depression. Get treatment if needed.  Keep your vaccines up to date. Get the flu shot (influenza vaccine) every year.  Keep all follow-up visits as told by your doctor. This is important. Contact a doctor if:  You feel very sad.  You have trouble doing your daily activities. Get help right away if:  You have sudden, unexplained discomfort in your chest, arms, back, neck, jaw, or upper body.  You have shortness of breath.  You have sudden sweating or clammy skin.  You feel like you may vomit.  You vomit.  You feel tired or weak.  You get light-headed or dizzy.  You feel your heart beating fast.  You feel your heart skipping beats.  You have blood pressure that is higher than 180/120. These symptoms may be an emergency. Do not wait to see if the symptoms will go away. Get medical help right away. Call your local emergency services (911 in the [place] Do not drive yourself to the hospital. Summary  A heart attack occurs when blood and oxygen supply to the heart is cut off.  Do not take NSAIDs unless your doctor says it is okay.  Do not smoke. Avoid secondhand smoke.  Exercise regularly. Ask your doctor about a cardiac rehab  program. This information is not intended to replace advice given to you by your health care provider. Make sure you discuss any questions you have with your health care provider. Document Revised: 09/26/2018 Document Reviewed: 09/26/2018 Elsevier Patient Education  2020 [employer].

## 2019-08-07 NOTE — Assessment & Plan Note (Addendum)
Unable to find notes from Dr. Avelino Leeds in [place] careeverywhere. Based on patient's H/o MI, she is off her antihypertensive medications, current smoker and symptoms, I have great concern that chest pain could be cardiac in origin.  No reproducible chest wall tenderness .patient has multiple cardiovascular risk factors which is why I placed referral to cardiology urgently.  EKG reviewed by myself showed NSR, no significant changes when compared to prior in 2011.

## 2019-08-07 NOTE — Assessment & Plan Note (Addendum)
Uncontrolled. Some degree of orthostatic hypotension ( see flow sheet). Advised caution with position changes. Start losartan 50mg  and repeat BMP one week. Close f/u in 2 weeks.

## 2019-08-07 NOTE — Assessment & Plan Note (Addendum)
Patient has been lost to follow-up, and off all medications today.  She is establishing care today.  I have ordered what labs, imaging Medicare would allow.  We will have close follow-up as I discussed with patient and to get her up-to-date on preventative care.  Of note, she is not fasting today however out of concern for not coming back for another lab visit , I went ahead and ordered lipid panel.

## 2019-08-08 ENCOUNTER — Encounter: Payer: Self-pay | Admitting: *Deleted

## 2019-08-08 ENCOUNTER — Other Ambulatory Visit: Payer: Self-pay | Admitting: Family

## 2019-08-08 ENCOUNTER — Telehealth: Payer: Self-pay | Admitting: *Deleted

## 2019-08-08 DIAGNOSIS — Z87891 Personal history of nicotine dependence: Secondary | ICD-10-CM

## 2019-08-08 DIAGNOSIS — I10 Essential (primary) hypertension: Secondary | ICD-10-CM

## 2019-08-08 LAB — HEPATITIS C ANTIBODY
Hepatitis C Ab: NONREACTIVE
SIGNAL TO CUT-OFF: 0.02 (ref <1.00)

## 2019-08-08 NOTE — Telephone Encounter (Signed)
Received referral for initial lung cancer screening scan. Contacted patient and obtained smoking history,(current, 78 pack year) as well as answering questions related to screening process. Patient denies signs of lung cancer such as weight loss or hemoptysis. Patient denies comorbidity that would prevent curative treatment if lung cancer were found. Patient is scheduled for shared decision making visit and CT scan on 08/15/19 at 130pm.

## 2019-08-09 ENCOUNTER — Other Ambulatory Visit: Payer: Self-pay

## 2019-08-09 MED ORDER — ATORVASTATIN CALCIUM 10 MG PO TABS: 10.0000 mg | ORAL_TABLET | Freq: Every day | ORAL | 3 refills | Status: DC

## 2019-08-09 MED ORDER — ASPIRIN EC 81 MG PO TBEC: 81.0000 mg | DELAYED_RELEASE_TABLET | Freq: Every day | ORAL | 0 refills | Status: AC

## 2019-08-10 NOTE — Progress Notes (Signed)
Done

## 2019-08-14 ENCOUNTER — Other Ambulatory Visit: Payer: Self-pay

## 2019-08-14 ENCOUNTER — Other Ambulatory Visit (INDEPENDENT_AMBULATORY_CARE_PROVIDER_SITE_OTHER): Payer: Medicare Other

## 2019-08-14 DIAGNOSIS — I10 Essential (primary) hypertension: Secondary | ICD-10-CM

## 2019-08-14 LAB — COMPREHENSIVE METABOLIC PANEL
ALT: 14 U/L (ref 0–35)
AST: 17 U/L (ref 0–37)
Albumin: 4.4 g/dL (ref 3.5–5.2)
Alkaline Phosphatase: 81 U/L (ref 39–117)
BUN: 10 mg/dL (ref 6–23)
CO2: 28 mEq/L (ref 19–32)
Calcium: 9.9 mg/dL (ref 8.4–10.5)
Chloride: 101 mEq/L (ref 96–112)
Creatinine, Ser: 0.87 mg/dL (ref 0.40–1.20)
GFR: 63.99 mL/min (ref >60.00)
Glucose, Bld: 109 mg/dL — ABNORMAL HIGH (ref 70–99)
Potassium: 4.3 mEq/L (ref 3.5–5.1)
Sodium: 138 mEq/L (ref 135–145)
Total Bilirubin: 0.6 mg/dL (ref 0.2–1.2)
Total Protein: 7.8 g/dL (ref 6.0–8.3)

## 2019-08-15 ENCOUNTER — Inpatient Hospital Stay: Payer: Medicare Other | Attending: Oncology | Admitting: Oncology

## 2019-08-15 ENCOUNTER — Ambulatory Visit
Admission: RE | Admit: 2019-08-15 | Discharge: 2019-08-15 | Disposition: A | Discharge status: Home / Self Care | Payer: Medicare Other | Source: Ambulatory Visit | Attending: Nurse Practitioner | Admitting: Nurse Practitioner

## 2019-08-15 DIAGNOSIS — Z87891 Personal history of nicotine dependence: Secondary | ICD-10-CM

## 2019-08-15 DIAGNOSIS — F17210 Nicotine dependence, cigarettes, uncomplicated: Secondary | ICD-10-CM

## 2019-08-15 NOTE — Progress Notes (Signed)
Virtual Visit via Video Note  I connected with Susan Cole on 08/15/19 at  1:30 PM EST by a video enabled telemedicine application and verified that I am speaking with the correct person using two identifiers.  Location: Patient: [facility] Provider: Office   I discussed the limitations of evaluation and management by telemedicine and the availability of in person appointments. The patient expressed understanding and agreed to proceed.  I discussed the assessment and treatment plan with the patient. The patient was provided an opportunity to ask questions and all were answered. The patient agreed with the plan and demonstrated an understanding of the instructions.   The patient was advised to call back or seek an in-person evaluation if the symptoms worsen or if the condition fails to improve as anticipated.   In accordance with CMS guidelines, patient has met eligibility criteria including age, absence of signs or symptoms of lung cancer.  Social History   Tobacco Use  . Smoking status: Current Every Day Smoker    Packs/day: 1.50    Years: 52.00    Pack years: 78.00    Types: Cigarettes  . Smokeless tobacco: Never Used  Substance Use Topics  . Alcohol use: No    Alcohol/week: 0.0 standard drinks  . Drug use: No      A shared decision-making session was conducted prior to the performance of CT scan. This includes one or more decision aids, includes benefits and harms of screening, follow-up diagnostic testing, over-diagnosis, false positive rate, and total radiation exposure.   Counseling on the importance of adherence to annual lung cancer LDCT screening, impact of co-morbidities, and ability or willingness to undergo diagnosis and treatment is imperative for compliance of the program.   Counseling on the importance of continued smoking cessation for former smokers; the importance of smoking cessation for current smokers, and information about tobacco cessation interventions have been  given to patient including [place] and 1800 quit [place] programs.   Written order for lung cancer screening with LDCT has been given to the patient and any and all questions have been answered to the best of my abilities.    Yearly follow up will be coordinated by Burgess Estelle, Thoracic Navigator.  I provided 15 minutes of face-to-face video visit time during this encounter, and > 50% was spent counseling as documented under my assessment & plan.   Jacquelin Hawking, NP

## 2019-08-16 ENCOUNTER — Other Ambulatory Visit: Payer: Self-pay | Admitting: Family

## 2019-08-16 ENCOUNTER — Encounter: Payer: Self-pay | Admitting: Family

## 2019-08-16 DIAGNOSIS — E041 Nontoxic single thyroid nodule: Secondary | ICD-10-CM

## 2019-08-16 DIAGNOSIS — I2510 Atherosclerotic heart disease of native coronary artery without angina pectoris: Secondary | ICD-10-CM | POA: Insufficient documentation

## 2019-08-16 DIAGNOSIS — E785 Hyperlipidemia, unspecified: Secondary | ICD-10-CM

## 2019-08-16 NOTE — Progress Notes (Signed)
Subjective:    Patient ID: Susan Cole, female    DOB: April 30, 1946, 74 y.o.   MRN: [id number]  CC: Susan Cole is a 74 y.o. female who presents today for follow up.   HPI: Feels well today.  Here to discuss findings on CT chest lung cancer screening also ultrasound of thyroid Hypertension-she been compliant with losartan.  States she does have occasional left-sided chest pain, presentation is unchanged in frequency has not increased.  This occurs predominantly at night when she is lying down.  She thinks it perhaps could be acid reflux.  No shortness of breath, left arm pain, nausea or regurgitation.  she has had trouble swallowing pills and feels like there is something "stuck in her throat".  No unusual weight loss.  She states she has echocardiogram tomorrow.  Patient was seen by Susan Cole for atypical chest pain.  Stress echo has been ordered.  She is to follow-up with Cole in 4 weeks  HISTORY:  Past Medical History:  Diagnosis Date  . Myocardial infarction [facility]) 2012   [facility] seen by Susan Cole  . Urine incontinence   . UTI (lower urinary tract infection)    Past Surgical History:  Procedure Laterality Date  . CARDIAC CATHETERIZATION  2012  . TUBAL LIGATION  1985   Family History  Problem Relation Age of Onset  . Stroke Mother 4  . Hypertension Mother   . Diabetes Mother   . Goiter Mother   . Cancer Father 91       Lung CA  . Diabetes Sister   . Diabetes Brother   . Diabetes Brother   . Kidney disease Brother     Allergies: Patient has no known allergies. Current Outpatient Medications on File Prior to Visit  Medication Sig Dispense Refill  . aspirin EC 81 MG tablet Take 1 tablet (81 mg total) by mouth daily. 30 tablet 0  . atorvastatin (LIPITOR) 20 MG tablet Take 1 tablet (20 mg total) by mouth daily. 90 tablet 3  . losartan (COZAAR) 50 MG tablet Take 1 tablet (50 mg total) by mouth daily. 90 tablet 0   No current facility-administered  medications on file prior to visit.    Social History   Tobacco Use  . Smoking status: Current Every Day Smoker    Packs/day: 1.50    Years: 52.00    Pack years: 78.00    Types: Cigarettes  . Smokeless tobacco: Never Used  Substance Use Topics  . Alcohol use: No    Alcohol/week: 0.0 standard drinks  . Drug use: No    Review of Systems  Constitutional: Negative for chills and fever.  HENT: Positive for trouble swallowing.   Respiratory: Negative for cough.   Cardiovascular: Negative for chest pain and palpitations.  Gastrointestinal: Negative for nausea and vomiting.      Objective:    BP 130/80   Pulse 95   Temp 97.8 F (36.6 C) (Temporal)   Resp 16   Ht 5\' 3"  (1.6 m)   Wt 175 lb (79.4 kg)   SpO2 96%   BMI 31.00 kg/m  BP Readings from Last 3 Encounters:  08/23/19 130/80  08/07/19 (!) 162/96  01/01/15 (!) 142/90   Wt Readings from Last 3 Encounters:  08/23/19 175 lb (79.4 kg)  08/15/19 172 lb (78 kg)  08/07/19 175 lb 12.8 oz (79.7 kg)    Physical Exam Vitals reviewed.  Constitutional:      Appearance: She is well-developed.  Eyes:     Conjunctiva/sclera: Conjunctivae normal.  Neck:     Thyroid: No thyroid mass, thyromegaly or thyroid tenderness.     Comments: No abnormalities of thyroid on exam Cardiovascular:     Rate and Rhythm: Normal rate and regular rhythm.     Pulses: Normal pulses.     Heart sounds: Normal heart sounds.  Pulmonary:     Effort: Pulmonary effort is normal.     Breath sounds: Normal breath sounds. No wheezing, rhonchi or rales.  Skin:    General: Skin is warm and dry.  Neurological:     Mental Status: She is alert.  Psychiatric:        Speech: Speech normal.        Behavior: Behavior normal.        Thought Content: Thought content normal.        Assessment & Plan:   Problem List Items Addressed This Visit      Cardiovascular and Mediastinum   Atherosclerosis of aorta ([place])    Discussed atherosclerosis, advised  patient maintain on Lipitor.  We will continue to watch cholesterol.  She is having a stress echocardiogram tomorrow      HTN (hypertension)    Improved.  When retaken by me today, blood pressure was approaching normal range.  We will continue to monitor.  Discussed dietary modifications and exercise as would like patient to be closer to 010 systolic.         Endocrine   Thyroid nodule - Primary    Discussed this in detail with patient today.  Referral placed to endocrine for biopsy 5 further evaluation and surveillance.  Also discussed with patient dysphagia and whether this is related to thyroid, GERD or even esophageal stricture.  We will start with endocrine referral and discuss at subsequent visit      Relevant Orders   Ambulatory referral to Endocrinology     Other   Chest pain    Unchanged, this is not worsened or become more frequent.  She will continue to stay vigilant in this regard and follow with Cole.  Advised if she would develop persistent or severe chest pain, shortness of breath, nausea or left arm pain, she would need to call 911.       Other Visit Diagnoses    Screening for osteoporosis       Relevant Orders   DG Bone Density     Of note, patient will schedule mammogram and bone density  I am having Susan Cole maintain her losartan, aspirin EC, and atorvastatin.   No orders of the defined types were placed in this encounter.   Return precautions given.   Risks, benefits, and alternatives of the medications and treatment plan prescribed today were discussed, and patient expressed understanding.   Education regarding symptom management and diagnosis given to patient on AVS.  Continue to follow with Susan Hawthorne, FNP for routine Cole maintenance.   Susan Cole and I agreed with plan.   Susan Paris, FNP

## 2019-08-18 ENCOUNTER — Telehealth: Payer: Self-pay | Admitting: *Deleted

## 2019-08-18 ENCOUNTER — Other Ambulatory Visit: Payer: Self-pay

## 2019-08-18 MED ORDER — ATORVASTATIN CALCIUM 20 MG PO TABS: 20.0000 mg | ORAL_TABLET | Freq: Every day | ORAL | 3 refills | Status: AC

## 2019-08-18 NOTE — Telephone Encounter (Signed)
Notified patient of LDCT lung cancer screening program results with recommendation for 6 month follow up imaging. Also notified of incidental findings noted below and is encouraged to discuss further with PCP who will receive a copy of this note and/or the CT report. Patient verbalizes understanding. Patient has already been scheduled for [place] evaluation of thyroid finding.   IMPRESSION: Lung-RADS 3S, probably benign findings. Short-term follow-up in 6 months is recommended with repeat low-dose chest CT without contrast (please use the following order, "CT CHEST LCS NODULE FOLLOW-UP W/O CM").  6.0 mm irregular nodule in the posterior left upper lobe.  A Lung-RADS "S" modifier is used due to the presence of a potentially clinically significant finding, in this case, a 2.4 cm partially calcified exophytic right thyroid nodule, favoring substernal goiter. Consider thyroid ultrasound for further evaluation as clinically warranted.  Aortic Atherosclerosis (ICD10-I70.0) and Emphysema (ICD10-J43.9).

## 2019-08-22 ENCOUNTER — Ambulatory Visit
Admission: RE | Admit: 2019-08-22 | Discharge: 2019-08-22 | Disposition: A | Discharge status: Home / Self Care | Payer: Medicare Other | Source: Ambulatory Visit | Attending: Family | Admitting: Family

## 2019-08-22 ENCOUNTER — Other Ambulatory Visit: Payer: Self-pay

## 2019-08-22 DIAGNOSIS — E041 Nontoxic single thyroid nodule: Secondary | ICD-10-CM | POA: Diagnosis not present

## 2019-08-23 ENCOUNTER — Encounter: Payer: Self-pay | Admitting: Family

## 2019-08-23 ENCOUNTER — Ambulatory Visit (INDEPENDENT_AMBULATORY_CARE_PROVIDER_SITE_OTHER): Payer: Medicare Other | Admitting: Family

## 2019-08-23 ENCOUNTER — Encounter: Payer: Self-pay | Admitting: *Deleted

## 2019-08-23 VITALS — BP 130/80 | HR 95 | Temp 97.8°F | Resp 16 | Ht 63.0 in | Wt 175.0 lb

## 2019-08-23 DIAGNOSIS — E041 Nontoxic single thyroid nodule: Secondary | ICD-10-CM | POA: Diagnosis not present

## 2019-08-23 DIAGNOSIS — I10 Essential (primary) hypertension: Secondary | ICD-10-CM | POA: Diagnosis not present

## 2019-08-23 DIAGNOSIS — I700 Atherosclerosis of aorta: Secondary | ICD-10-CM | POA: Diagnosis not present

## 2019-08-23 DIAGNOSIS — Z13820 Encounter for screening for osteoporosis: Secondary | ICD-10-CM

## 2019-08-23 DIAGNOSIS — R079 Chest pain, unspecified: Secondary | ICD-10-CM

## 2019-08-23 NOTE — Patient Instructions (Addendum)
Please ensure that you called and have scheduled Short-term follow-up in 6 Months ( August of 2021) is recommended with repeat low-dose chest CT without contrast. You may call [place] - [phone] to schedule.   Your thyroid nodule requires follow-up in 1 year.  Please call us in [place] of 2022 to schedule; However I am also placing referral for you to be further evaluated as well.  Please ensure that you call our office in this regards, if not please let me know   Please call call and schedule your 3D mammogram, bone density scan as discussed.   [facility]  [address]  [place], [place]  [phone]

## 2019-08-23 NOTE — Assessment & Plan Note (Addendum)
Improved.  When retaken by me today, blood pressure was approaching normal range.  We will continue to monitor.  Discussed dietary modifications and exercise as would like patient to be closer to 120 systolic.

## 2019-08-23 NOTE — Assessment & Plan Note (Signed)
Discussed this in detail with patient today.  Referral placed to endocrine for biopsy 5 further evaluation and surveillance.  Also discussed with patient dysphagia and whether this is related to thyroid, GERD or even esophageal stricture.  We will start with endocrine referral and discuss at subsequent visit

## 2019-08-23 NOTE — Telephone Encounter (Signed)
Noted Discussed with patient today during office visit

## 2019-08-23 NOTE — Assessment & Plan Note (Signed)
Unchanged, this is not worsened or become more frequent.  She will continue to stay vigilant in this regard and follow with cardiology.  Advised if she would develop persistent or severe chest pain, shortness of breath, nausea or left arm pain, she would need to call 911.

## 2019-08-23 NOTE — Assessment & Plan Note (Signed)
Discussed atherosclerosis, advised patient maintain on Lipitor.  We will continue to watch cholesterol.  She is having a stress echocardiogram tomorrow

## 2019-09-04 ENCOUNTER — Ambulatory Visit (INDEPENDENT_AMBULATORY_CARE_PROVIDER_SITE_OTHER): Payer: Medicare Other

## 2019-09-04 ENCOUNTER — Other Ambulatory Visit: Payer: Self-pay

## 2019-09-04 ENCOUNTER — Encounter: Payer: Self-pay | Admitting: Family

## 2019-09-04 VITALS — Ht 63.0 in | Wt 175.0 lb

## 2019-09-04 DIAGNOSIS — Z0000 Encounter for general adult medical examination without abnormal findings: Secondary | ICD-10-CM

## 2019-09-04 NOTE — Patient Instructions (Addendum)
  Ms. Geier , Thank you for taking time to come for your Medicare Wellness Visit. I appreciate your ongoing commitment to your health goals. Please review the following plan we discussed and let me know if I can assist you in the future.   Number provided to [facility] For scheduling [phone].  These are the goals we discussed: Goals      Patient Stated   . DIET - INCREASE WATER INTAKE (pt-stated)       This is a list of the screening recommended for you and due dates:  Health Maintenance  Topic Date Due  . Tetanus Vaccine  07/21/1966  . Mammogram  07/21/1997  . DEXA scan (bone density measurement)  07/21/2012  . Colon Cancer Screening  09/03/2020*  . Pneumonia vaccines (2 of 2 - PPSV23) 08/06/2020  . Flu Shot  Completed  .  Hepatitis C: One time screening is recommended by Center for Disease Control  (CDC) for  adults born from [birth year] through 1963.   Completed  *Topic was postponed. The date shown is not the original due date.

## 2019-09-04 NOTE — Progress Notes (Addendum)
Subjective:   Susan Cole is a 74 y.o. female who presents for an Initial Medicare Annual Wellness Visit.  Review of Systems    No ROS.  Medicare Wellness Virtual Visit.  Visual/audio telehealth visit, UTA vital signs.   Ht/Wt provided.  See social history for additional risk factors.  Cardiac Risk Factors include: advanced age (>55men, >39 women);hypertension;smoking/ tobacco exposure     Objective:    Today's Vitals   09/04/19 1138  Weight: 175 lb (79.4 kg)  Height: 5\' 3"  (1.6 m)   Body mass index is 31 kg/m.  Advanced Directives 09/04/2019  Does Patient Have a Medical Advance Directive? No  Would patient like information on creating a medical advance directive? No - Patient declined    Current Medications (verified) Outpatient Encounter Medications as of 09/04/2019  Medication Sig  . aspirin EC 81 MG tablet Take 1 tablet (81 mg total) by mouth daily.  11/04/2019 atorvastatin (LIPITOR) 20 MG tablet Take 1 tablet (20 mg total) by mouth daily.  Marland Kitchen losartan (COZAAR) 50 MG tablet Take 1 tablet (50 mg total) by mouth daily.   No facility-administered encounter medications on file as of 09/04/2019.    Allergies (verified) Patient has no known allergies.   History: Past Medical History:  Diagnosis Date  . Myocardial infarction Susan Cole) 2012   [facility] seen by Dr. 2013  . Urine incontinence   . UTI (lower urinary tract infection)    Past Surgical History:  Procedure Laterality Date  . CARDIAC CATHETERIZATION  2012  . TUBAL LIGATION  1985   Family History  Problem Relation Age of Onset  . Stroke Mother 75  . Hypertension Mother   . Diabetes Mother   . Goiter Mother   . Cancer Father 45       Lung CA  . Diabetes Sister   . Diabetes Brother   . Diabetes Brother   . Kidney disease Brother    Social History   Socioeconomic History  . Marital status: Married    Spouse name: Not on file  . Number of children: Not on file  . Years of education: Not on file  . Highest  education level: Not on file  Occupational History  . Not on file  Tobacco Use  . Smoking status: Current Every Day Smoker    Packs/day: 1.50    Years: 52.00    Pack years: 78.00    Types: Cigarettes  . Smokeless tobacco: Never Used  Substance and Sexual Activity  . Alcohol use: No    Alcohol/week: 0.0 standard drinks  . Drug use: No  . Sexual activity: Yes    Birth control/protection: Surgical  Other Topics Concern  . Not on file  Social History Narrative   Patient grew up in [place], [place]. She currently lives at home with her husband of 45 years. They have 1 son. She is retired and enjoys shopping and watching TV. She also loves animals.   Social Determinants of Health   Financial Resource Strain:   . Difficulty of Paying Living Expenses: Not on file  Food Insecurity:   . Worried About [place] in the Last Year: Not on file  . Ran Out of Food in the Last Year: Not on file  Transportation Needs:   . Lack of Transportation (Medical): Not on file  . Lack of Transportation (Non-Medical): Not on file  Physical Activity:   . Days of Exercise per Week: Not on file  . Minutes of  Exercise per Session: Not on file  Stress:   . Feeling of Stress : Not on file  Social Connections:   . Frequency of Communication with Friends and Family: Not on file  . Frequency of Social Gatherings with Friends and Family: Not on file  . Attends Religious Services: Not on file  . Active Member of Clubs or Organizations: Not on file  . Attends Archivist Meetings: Not on file  . Marital Status: Not on file    Tobacco Counseling Ready to quit: Not Answered Counseling given: Not Answered   Clinical Intake:  Pre-visit preparation completed: Yes        Diabetes: No  How often do you need to have someone help you when you read instructions, pamphlets, or other written materials from your doctor or pharmacy?: 1 - Never  Interpreter Needed?: No      Activities of  Daily Living In your present state of health, do you have any difficulty performing the following activities: 09/04/2019  Hearing? N  Vision? N  Difficulty concentrating or making decisions? N  Walking or climbing stairs? N  Dressing or bathing? N  Doing errands, shopping? N  Preparing Food and eating ? N  Using the Toilet? N  In the past six months, have you accidently leaked urine? N  Do you have problems with loss of bowel control? N  Managing your Medications? N  Managing your Finances? N  Housekeeping or managing your Housekeeping? N  Some recent data might be hidden     Immunizations and Health Maintenance Immunization History  Administered Date(s) Administered  . Fluad Quad(high Dose 65+) 08/07/2019  . Influenza,inj,Quad PF,6+ Mos 03/08/2013  . Pneumococcal Conjugate-13 08/07/2019   Health Maintenance Due  Topic Date Due  . TETANUS/TDAP  07/21/1966  . MAMMOGRAM  07/21/1997  . DEXA SCAN  07/21/2012    Patient Care Team: Susan Hawthorne, FNP as PCP - General (Family Medicine)  Indicate any recent Medical Services you may have received from other than [facility] providers in the past year (date may be approximate).     Assessment:   This is a routine wellness examination for Susan Cole.  Nurse connected with patient 09/04/19 at 11:30 AM EST by a telephone enabled telemedicine application and verified that I am speaking with the correct person using two identifiers. Patient stated full name and DOB. Patient gave permission to continue with virtual visit. Patient's location was at home and Nurse's location was at Susan Cole office.   Patient is alert and oriented x3. Patient denies difficulty focusing or concentrating.  Health Maintenance Due: -Tdap vaccine- discussed; to be completed with doctor in visit or local pharmacy.   See completed HM at the end of note.   Eye: Visual acuity not assessed. Virtual visit. She plans to schedule.   Dental: Dentures-  yes  Hearing: Demonstrates normal hearing during visit.  Safety:  Patient feels safe at home- yes Patient does have smoke detectors at home- yes Patient does wear sunscreen or protective clothing when in direct sunlight - yes Patient does wear seat belt when in a moving vehicle - yes Patient drives- yes Adequate lighting in walkways free from debris- yes Grab bars and handrails used as appropriate- yes Ambulates with an assistive device- yes; cane as needed  Social: Alcohol intake - no  Smoking history- current Illicit drug use? none  Medication: Notes taking losartan 25 mg per direction of Cardiologist. Self managed - yes   Covid-19: Precautions and sickness symptoms  discussed. Wears mask, social distancing, hand hygiene as appropriate.   Activities of Daily Living Patient denies needing assistance with: household chores, feeding themselves, getting from bed to chair, getting to the toilet, bathing/showering, dressing, managing money, or preparing meals.   Discussed the importance of a healthy diet, water intake and the benefits of aerobic exercise.   Physical activity- walking the dog.  Diet:  Regular Water: 4 cups Caffeine: 3-4 cups  Other Providers Patient Care Team: Allegra Grana, FNP as PCP - General (Family Medicine)  Hearing/Vision screen  Hearing Screening   125Hz  250Hz  500Hz  1000Hz  2000Hz  3000Hz  4000Hz  6000Hz  8000Hz   Right ear:           Left ear:           Comments: Patient is able to hear conversational tones without difficulty.  No issues reported.  Vision Screening Comments: Wears corrective lenses Visual acuity not assessed, virtual visit.  She plans to schedule.      Dietary issues and exercise activities discussed: Current Exercise Habits: Home exercise routine, Type of exercise: walking, Intensity: Mild  Goals      Patient Stated   . DIET - INCREASE WATER INTAKE (pt-stated)      Depression Screen PHQ 2/9 Scores 09/04/2019 08/07/2019  04/19/2013 03/22/2013  PHQ - 2 Score 0 0 1 2  PHQ- 9 Score - - 1 7    Fall Risk Fall Risk  09/04/2019 08/23/2019 08/07/2019 03/22/2013  Falls in the past year? 0 0 1 No  Number falls in past yr: - - 1 -  Injury with Fall? - - 0 -  Risk for fall due to : - - History of fall(s) -  Follow up Falls evaluation completed Falls evaluation completed Falls evaluation completed -    Timed Get Up and Go Performed no, virtual visit  Cognitive Function:     6CIT Screen 09/04/2019  What Year? 0 points  What month? 0 points  What time? 0 points  Count back from 20 0 points  Months in reverse 0 points  Repeat phrase 2 points  Total Score 2    Screening Tests Health Maintenance  Topic Date Due  . TETANUS/TDAP  07/21/1966  . MAMMOGRAM  07/21/1997  . DEXA SCAN  07/21/2012  . COLONOSCOPY  09/03/2020 (Originally 07/21/1997)  . PNA vac Low Risk Adult (2 of 2 - PPSV23) 08/06/2020  . INFLUENZA VACCINE  Completed  . Hepatitis C Screening  Completed      Plan:   Keep all routine maintenance appointments.   Next scheduled lab 09/28/19 @ 1130, 10/09/19 @ 2:00   Medicare Attestation I have personally reviewed: The patient's medical and social history Their use of alcohol, tobacco or illicit drugs Their current medications and supplements The patient's functional ability including ADLs,fall risks, home safety risks, cognitive, and hearing and visual impairment Diet and physical activities Evidence for depression   I have reviewed and discussed with patient certain preventive protocols, quality metrics, and best practice recommendations.   11/04/2019, LPN   07/23/1966     Agree with plan. 07/23/1997, NP

## 2019-09-28 ENCOUNTER — Other Ambulatory Visit: Payer: Self-pay

## 2019-09-28 ENCOUNTER — Other Ambulatory Visit (INDEPENDENT_AMBULATORY_CARE_PROVIDER_SITE_OTHER): Payer: Medicare Other

## 2019-09-28 DIAGNOSIS — E785 Hyperlipidemia, unspecified: Secondary | ICD-10-CM

## 2019-09-28 LAB — COMPREHENSIVE METABOLIC PANEL
ALT: 11 U/L (ref 0–35)
AST: 16 U/L (ref 0–37)
Albumin: 4.2 g/dL (ref 3.5–5.2)
Alkaline Phosphatase: 72 U/L (ref 39–117)
BUN: 14 mg/dL (ref 6–23)
CO2: 29 mEq/L (ref 19–32)
Calcium: 9.5 mg/dL (ref 8.4–10.5)
Chloride: 105 mEq/L (ref 96–112)
Creatinine, Ser: 0.85 mg/dL (ref 0.40–1.20)
GFR: 65.71 mL/min (ref >60.00)
Glucose, Bld: 127 mg/dL — ABNORMAL HIGH (ref 70–99)
Potassium: 4.3 mEq/L (ref 3.5–5.1)
Sodium: 140 mEq/L (ref 135–145)
Total Bilirubin: 0.5 mg/dL (ref 0.2–1.2)
Total Protein: 7.2 g/dL (ref 6.0–8.3)

## 2019-10-09 ENCOUNTER — Other Ambulatory Visit: Payer: Medicare Other

## 2019-10-29 ENCOUNTER — Other Ambulatory Visit: Payer: Self-pay | Admitting: Family

## 2019-10-29 DIAGNOSIS — I10 Essential (primary) hypertension: Secondary | ICD-10-CM

## 2020-01-23 ENCOUNTER — Other Ambulatory Visit: Payer: Self-pay | Admitting: Family

## 2020-01-23 DIAGNOSIS — I10 Essential (primary) hypertension: Secondary | ICD-10-CM

## 2020-02-01 ENCOUNTER — Telehealth: Payer: Self-pay

## 2020-02-01 NOTE — Telephone Encounter (Signed)
Patient notified that lung screening imaging is due currently or in the near future. Patient does not want to schedule at this time but would like a return call at a later date.

## 2020-02-06 ENCOUNTER — Other Ambulatory Visit: Payer: Self-pay | Admitting: *Deleted

## 2020-02-06 ENCOUNTER — Telehealth: Payer: Self-pay | Admitting: *Deleted

## 2020-02-06 DIAGNOSIS — R918 Other nonspecific abnormal finding of lung field: Secondary | ICD-10-CM

## 2020-02-06 DIAGNOSIS — Z87891 Personal history of nicotine dependence: Secondary | ICD-10-CM

## 2020-02-06 NOTE — Telephone Encounter (Signed)
(  02/06/2020) Pt notified that lung cancer screening imaging is due currently or in the near future. Verified smoking history (Current Smoker,1.5 ppd ).Pt's preference is for Thursdays, tentative date of 8/26 @ 10:15 SRW

## 2020-02-22 ENCOUNTER — Ambulatory Visit: Payer: Medicare Other

## 2020-03-28 ENCOUNTER — Other Ambulatory Visit: Payer: Self-pay

## 2020-03-28 ENCOUNTER — Ambulatory Visit
Admission: RE | Admit: 2020-03-28 | Discharge: 2020-03-28 | Disposition: A | Discharge status: Home / Self Care | Payer: Medicare Other | Source: Ambulatory Visit | Attending: Nurse Practitioner | Admitting: Nurse Practitioner

## 2020-03-28 DIAGNOSIS — Z87891 Personal history of nicotine dependence: Secondary | ICD-10-CM | POA: Insufficient documentation

## 2020-03-28 DIAGNOSIS — R918 Other nonspecific abnormal finding of lung field: Secondary | ICD-10-CM

## 2020-04-01 ENCOUNTER — Telehealth: Payer: Self-pay | Admitting: *Deleted

## 2020-04-01 NOTE — Telephone Encounter (Signed)
Notified patient of LDCT lung cancer screening program results with recommendation for 6 month follow up imaging. Also notified of incidental findings noted below and is encouraged to discuss further with PCP who will receive a copy of this note and/or the CT report. Patient verbalizes understanding.   IMPRESSION: 1. Lung-RADS 3, probably benign findings. New 4.7 mm posterior left upper lobe pulmonary nodule. Previously visualized 6.0 mm posterior left upper lobe pulmonary nodule is stable. Short-term follow-up in 6 months is recommended with repeat low-dose chest CT without contrast (please use the following order, "CT CHEST LCS NODULE FOLLOW-UP W/O CM"). 2. Two-vessel coronary atherosclerosis. 3. Stable multinodular goiter. 4. Small hiatal hernia. 5. Cholelithiasis. 6. Aortic Atherosclerosis (ICD10-I70.0) and Emphysema (ICD10-J43.9).

## 2020-04-03 ENCOUNTER — Telehealth: Payer: Self-pay | Admitting: Family

## 2020-04-03 NOTE — Telephone Encounter (Signed)
L/m for patient to call back

## 2020-04-03 NOTE — Telephone Encounter (Signed)
Call pt Please sch follow up with so we can review CT chest   No acute findings but all warrant conversation as it showed :cholesterol build up around the heart ,goiter,Small hiatal hernia, Cholelithiasis ( gallstones), Emphysema

## 2020-04-04 NOTE — Telephone Encounter (Signed)
Patient scheduled 05/13/20 first available for appointment to discuss.

## 2020-04-15 ENCOUNTER — Other Ambulatory Visit: Payer: Self-pay | Admitting: Family

## 2020-04-15 DIAGNOSIS — I10 Essential (primary) hypertension: Secondary | ICD-10-CM

## 2020-05-06 NOTE — Progress Notes (Signed)
Subjective:    Patient ID: Susan Cole, female    DOB: 1946/05/26, 74 y.o.   MRN: [id number]  CC: Susan Cole is a 74 y.o. female who presents today for follow up and to discuss CT chest.    HPI: Couple days per month as low mood. This is infrequent and she doesn't feel she has depression. No si/hi She continues to do things that she enjoys.  Enjoys her dog.   CAD- compliant with lipitor; follows with Dr Gwen Pounds  HTN- compliant with losartan 100mg  in the afternoon.  Recently increased from 50mg  for a couple of weeks. At home 160/80.  Denies leg swelling, exertional chest pain or pressure, numbness or tingling radiating to left arm or jaw, palpitations, dizziness, frequent headaches, changes in vision, or shortness of breath.   H/o thyroid nodule NO trouble swallowing or pain with swallowing.   She has no abdominal pain or pain with eating.  echo stress 08/2019 EF 55%. Normal ECG.   CT chest 03/28/20 Atherosclerosis  Multinodular goiter, 2.4cm nodule largest- Moderate emphysema, new left upper love pulmonary nodule Small hiatal hernia Cholelithiasis Moderate thoracic spondylosis  09/2019 thyroid 07/2019 - recommend FNA of inferior 3.1 cm right nodule  [place] annual until stability of 5 years  FNA done with Dr 08/2019 10/24/19 ; follow up scheduled with Dr Tedd Sias 07/2020   HISTORY:  Past Medical History:  Diagnosis Date  . Myocardial infarction [facility]) 2012   [facility] seen by [facility]. [facility]  . Urine incontinence   . UTI (lower urinary tract infection)    Past Surgical History:  Procedure Laterality Date  . CARDIAC CATHETERIZATION  2012  . TUBAL LIGATION  1985   Family History  Problem Relation Age of Onset  . Stroke Mother 10  . Hypertension Mother   . Diabetes Mother   . Goiter Mother   . Cancer Father 19       Lung CA  . Diabetes Sister   . Diabetes Brother   . Diabetes Brother   . Kidney disease Brother     Allergies: Patient has no known allergies. Current  Outpatient Medications on File Prior to Visit  Medication Sig Dispense Refill  . Acetaminophen 500 MG capsule Take 1 capsule by mouth as needed.    41 aspirin EC 81 MG tablet Take 1 tablet (81 mg total) by mouth daily. 30 tablet 0  . atorvastatin (LIPITOR) 20 MG tablet Take 1 tablet (20 mg total) by mouth daily. 90 tablet 3  . losartan (COZAAR) 100 MG tablet Take 100 mg by mouth daily.     No current facility-administered medications on file prior to visit.    Social History   Tobacco Use  . Smoking status: Current Every Day Smoker    Packs/day: 1.50    Years: 52.00    Pack years: 78.00    Types: Cigarettes  . Smokeless tobacco: Never Used  Substance Use Topics  . Alcohol use: No    Alcohol/week: 0.0 standard drinks  . Drug use: No    Review of Systems  Constitutional: Negative for chills and fever.  Respiratory: Negative for cough and shortness of breath.   Cardiovascular: Negative for chest pain, palpitations and leg swelling.  Gastrointestinal: Negative for nausea and vomiting.  Neurological: Negative for seizures.      Objective:    BP (!) 160/90   Pulse 90   Temp 98.4 F (36.9 C)   Ht 5\' 3"  (1.6 m)   Wt 171  lb 3.2 oz (77.7 kg)   SpO2 96%   BMI 30.33 kg/m  BP Readings from Last 3 Encounters:  05/13/20 (!) 160/90  08/23/19 130/80  08/07/19 (!) 162/96   Wt Readings from Last 3 Encounters:  05/13/20 171 lb 3.2 oz (77.7 kg)  09/04/19 175 lb (79.4 kg)  08/23/19 175 lb (79.4 kg)    Physical Exam Vitals reviewed.  Constitutional:      Appearance: She is well-developed.  Eyes:     Conjunctiva/sclera: Conjunctivae normal.  Cardiovascular:     Rate and Rhythm: Normal rate and regular rhythm.     Pulses: Normal pulses.     Heart sounds: Normal heart sounds.  Pulmonary:     Effort: Pulmonary effort is normal.     Breath sounds: Normal breath sounds. No wheezing, rhonchi or rales.  Musculoskeletal:     Right lower leg: No edema.     Left lower leg: No  edema.  Skin:    General: Skin is warm and dry.  Neurological:     Mental Status: She is alert.  Psychiatric:        Speech: Speech normal.        Behavior: Behavior normal.        Thought Content: Thought content normal.        Assessment & Plan:   Problem List Items Addressed This Visit      Cardiovascular and Mediastinum   CAD (coronary artery disease)    Continue lipitor 20mg . Will update lipid panel at follow up and advised her to fast.      Relevant Medications   losartan (COZAAR) 100 MG tablet   amLODipine (NORVASC) 5 MG tablet   HTN (hypertension) - Primary    Uncontrolled despite recent increase of losartan 100mg .due to  Long h/o smoking, opted not to start beta blocker at this time. Start amlodipine 5mg  . She will monitor blood pressure at home. Will follow      Relevant Medications   losartan (COZAAR) 100 MG tablet   amLODipine (NORVASC) 5 MG tablet   Other Relevant Orders   Basic metabolic panel     Endocrine   Thyroid nodule    Discussed recent CT chest and thyroid nodule. She has upcoming appointment in 4 months with endocrine and understands the importance of this. Will follow       Other Visit Diagnoses    Need for immunization against influenza       Relevant Orders   Flu Vaccine QUAD High Dose(Fluad) (Completed)       I am having Clairessa P. Coval start on amLODipine. I am also having her maintain her aspirin EC, atorvastatin, losartan, and Acetaminophen.   Meds ordered this encounter  Medications  . amLODipine (NORVASC) 5 MG tablet    Sig: Take 1 tablet (5 mg total) by mouth daily.    Dispense:  90 tablet    Refill:  3    Order Specific Question:   Supervising Provider    Answer:   [[id number]]    Return precautions given.   Risks, benefits, and alternatives of the medications and treatment plan prescribed today were discussed, and patient expressed understanding.   Education regarding symptom management and diagnosis given  to patient on AVS.  Continue to follow with , FNP for routine health maintenance.   and I agreed with plan.   Sherlene Shams, FNP

## 2020-05-09 ENCOUNTER — Other Ambulatory Visit: Payer: Self-pay

## 2020-05-13 ENCOUNTER — Other Ambulatory Visit: Payer: Self-pay

## 2020-05-13 ENCOUNTER — Ambulatory Visit (INDEPENDENT_AMBULATORY_CARE_PROVIDER_SITE_OTHER): Payer: Medicare Other | Admitting: Family

## 2020-05-13 ENCOUNTER — Encounter: Payer: Self-pay | Admitting: Family

## 2020-05-13 VITALS — BP 160/90 | HR 90 | Temp 98.4°F | Ht 63.0 in | Wt 171.2 lb

## 2020-05-13 DIAGNOSIS — I10 Essential (primary) hypertension: Secondary | ICD-10-CM

## 2020-05-13 DIAGNOSIS — Z23 Encounter for immunization: Secondary | ICD-10-CM | POA: Diagnosis not present

## 2020-05-13 DIAGNOSIS — E041 Nontoxic single thyroid nodule: Secondary | ICD-10-CM

## 2020-05-13 DIAGNOSIS — I2510 Atherosclerotic heart disease of native coronary artery without angina pectoris: Secondary | ICD-10-CM | POA: Diagnosis not present

## 2020-05-13 MED ORDER — AMLODIPINE BESYLATE 5 MG PO TABS: 5.0000 mg | ORAL_TABLET | Freq: Every day | ORAL | 3 refills | Status: AC

## 2020-05-13 NOTE — Assessment & Plan Note (Signed)
Discussed recent CT chest and thyroid nodule. She has upcoming appointment in 4 months with endocrine and understands the importance of this. Will follow

## 2020-05-13 NOTE — Telephone Encounter (Signed)
Shawn,  Just double checking, your office will contact pt march 2022 for repeat CT chest?

## 2020-05-13 NOTE — Assessment & Plan Note (Addendum)
Uncontrolled despite recent increase of losartan 100mg .due to  Long h/o smoking, opted not to start beta blocker at this time. Start amlodipine 5mg  . She will monitor blood pressure at home. Will follow

## 2020-05-13 NOTE — Patient Instructions (Signed)
Continue losartan 100mg  Start amlodipine 5mg  for blood pressure  It is imperative that you are seen AT least twice per year for labs and monitoring. Monitor blood pressure at home and me 5-6 reading on separate days. Goal is less than 120/80, based on newest guidelines, however we certainly want to be less than 130/80;  if persistently higher, please make sooner follow up appointment so we can recheck you blood pressure and manage/ adjust medications.  Nice to see you!

## 2020-05-13 NOTE — Assessment & Plan Note (Signed)
Continue lipitor 20mg . Will update lipid panel at follow up and advised her to fast.

## 2020-05-14 LAB — BASIC METABOLIC PANEL
BUN: 14 mg/dL (ref 6–23)
CO2: 30 mEq/L (ref 19–32)
Calcium: 9.8 mg/dL (ref 8.4–10.5)
Chloride: 101 mEq/L (ref 96–112)
Creatinine, Ser: 0.84 mg/dL (ref 0.40–1.20)
GFR: 69.23 mL/min (ref >60.00)
Glucose, Bld: 102 mg/dL — ABNORMAL HIGH (ref 70–99)
Potassium: 4.5 mEq/L (ref 3.5–5.1)
Sodium: 139 mEq/L (ref 135–145)

## 2020-05-14 NOTE — Telephone Encounter (Signed)
Yes, the lung screening office will contact patient close to due date of 6 month follow up recommendation for scheduling of CT scan.

## 2020-05-20 ENCOUNTER — Emergency Department: Payer: Medicare Other

## 2020-05-20 ENCOUNTER — Inpatient Hospital Stay
Admission: EM | Admit: 2020-05-20 | Discharge: 2020-05-29 | DRG: 871 | Disposition: E | Payer: Medicare Other | Attending: Pulmonary Disease | Admitting: Pulmonary Disease

## 2020-05-20 ENCOUNTER — Inpatient Hospital Stay: Payer: Medicare Other

## 2020-05-20 DIAGNOSIS — K922 Gastrointestinal hemorrhage, unspecified: Secondary | ICD-10-CM | POA: Diagnosis present

## 2020-05-20 DIAGNOSIS — Z7982 Long term (current) use of aspirin: Secondary | ICD-10-CM | POA: Diagnosis not present

## 2020-05-20 DIAGNOSIS — A419 Sepsis, unspecified organism: Principal | ICD-10-CM | POA: Diagnosis present

## 2020-05-20 DIAGNOSIS — I10 Essential (primary) hypertension: Secondary | ICD-10-CM | POA: Diagnosis present

## 2020-05-20 DIAGNOSIS — Z8249 Family history of ischemic heart disease and other diseases of the circulatory system: Secondary | ICD-10-CM | POA: Diagnosis not present

## 2020-05-20 DIAGNOSIS — Z833 Family history of diabetes mellitus: Secondary | ICD-10-CM | POA: Diagnosis not present

## 2020-05-20 DIAGNOSIS — R6521 Severe sepsis with septic shock: Secondary | ICD-10-CM | POA: Diagnosis present

## 2020-05-20 DIAGNOSIS — Z9289 Personal history of other medical treatment: Secondary | ICD-10-CM

## 2020-05-20 DIAGNOSIS — J9601 Acute respiratory failure with hypoxia: Secondary | ICD-10-CM | POA: Diagnosis present

## 2020-05-20 DIAGNOSIS — I469 Cardiac arrest, cause unspecified: Secondary | ICD-10-CM | POA: Diagnosis present

## 2020-05-20 DIAGNOSIS — J189 Pneumonia, unspecified organism: Secondary | ICD-10-CM | POA: Diagnosis present

## 2020-05-20 DIAGNOSIS — I252 Old myocardial infarction: Secondary | ICD-10-CM

## 2020-05-20 DIAGNOSIS — Z841 Family history of disorders of kidney and ureter: Secondary | ICD-10-CM | POA: Diagnosis not present

## 2020-05-20 DIAGNOSIS — Z801 Family history of malignant neoplasm of trachea, bronchus and lung: Secondary | ICD-10-CM | POA: Diagnosis not present

## 2020-05-20 DIAGNOSIS — I248 Other forms of acute ischemic heart disease: Secondary | ICD-10-CM | POA: Diagnosis present

## 2020-05-20 DIAGNOSIS — Z823 Family history of stroke: Secondary | ICD-10-CM

## 2020-05-20 DIAGNOSIS — Z20822 Contact with and (suspected) exposure to covid-19: Secondary | ICD-10-CM | POA: Diagnosis present

## 2020-05-20 DIAGNOSIS — Z79899 Other long term (current) drug therapy: Secondary | ICD-10-CM | POA: Diagnosis not present

## 2020-05-20 DIAGNOSIS — J9600 Acute respiratory failure, unspecified whether with hypoxia or hypercapnia: Secondary | ICD-10-CM | POA: Diagnosis present

## 2020-05-20 DIAGNOSIS — F17210 Nicotine dependence, cigarettes, uncomplicated: Secondary | ICD-10-CM | POA: Diagnosis present

## 2020-05-20 DIAGNOSIS — I2510 Atherosclerotic heart disease of native coronary artery without angina pectoris: Secondary | ICD-10-CM | POA: Diagnosis present

## 2020-05-20 LAB — CBC
HCT: 44.9 % (ref 36.0–46.0)
Hemoglobin: 12.9 g/dL (ref 12.0–15.0)
MCH: 29.9 pg (ref 26.0–34.0)
MCHC: 28.7 g/dL — ABNORMAL LOW (ref 30.0–36.0)
MCV: 103.9 fL — ABNORMAL HIGH (ref 80.0–100.0)
Platelets: 139 10*3/uL — ABNORMAL LOW (ref 150–400)
RBC: 4.32 MIL/uL (ref 3.87–5.11)
RDW: 13.6 % (ref 11.5–15.5)
WBC: 13.3 10*3/uL — ABNORMAL HIGH (ref 4.0–10.5)
nRBC: 0.7 % — ABNORMAL HIGH (ref 0.0–0.2)

## 2020-05-20 LAB — URINALYSIS, COMPLETE (UACMP) WITH MICROSCOPIC
Bacteria, UA: NONE SEEN
Bilirubin Urine: NEGATIVE
Glucose, UA: NEGATIVE mg/dL
Hgb urine dipstick: NEGATIVE
Ketones, ur: NEGATIVE mg/dL
Leukocytes,Ua: NEGATIVE
Nitrite: NEGATIVE
Protein, ur: NEGATIVE mg/dL
Specific Gravity, Urine: 1.023 (ref 1.005–1.030)
pH: 5 (ref 5.0–8.0)

## 2020-05-20 LAB — COMPREHENSIVE METABOLIC PANEL
ALT: 65 U/L — ABNORMAL HIGH (ref 0–44)
AST: 79 U/L — ABNORMAL HIGH (ref 15–41)
Albumin: 2.8 g/dL — ABNORMAL LOW (ref 3.5–5.0)
Alkaline Phosphatase: 65 U/L (ref 38–126)
Anion gap: 25 — ABNORMAL HIGH (ref 5–15)
BUN: 15 mg/dL (ref 8–23)
CO2: 16 mmol/L — ABNORMAL LOW (ref 22–32)
Calcium: 8.7 mg/dL — ABNORMAL LOW (ref 8.9–10.3)
Chloride: 101 mmol/L (ref 98–111)
Creatinine, Ser: 1.54 mg/dL — ABNORMAL HIGH (ref 0.44–1.00)
GFR, Estimated: 36 mL/min — ABNORMAL LOW (ref >60)
Glucose, Bld: 390 mg/dL — ABNORMAL HIGH (ref 70–99)
Potassium: 3.5 mmol/L (ref 3.5–5.1)
Sodium: 142 mmol/L (ref 135–145)
Total Bilirubin: 0.8 mg/dL (ref 0.3–1.2)
Total Protein: 5.8 g/dL — ABNORMAL LOW (ref 6.5–8.1)

## 2020-05-20 LAB — APTT: aPTT: 73 seconds — ABNORMAL HIGH (ref 24–36)

## 2020-05-20 LAB — CK: Total CK: 119 U/L (ref 38–234)

## 2020-05-20 LAB — RESP PANEL BY RT-PCR (FLU A&B, COVID) ARPGX2
Influenza A by PCR: NEGATIVE
Influenza B by PCR: NEGATIVE
SARS Coronavirus 2 by RT PCR: NEGATIVE

## 2020-05-20 LAB — PROTIME-INR
INR: 1.8 — ABNORMAL HIGH (ref 0.8–1.2)
Prothrombin Time: 20.5 seconds — ABNORMAL HIGH (ref 11.4–15.2)

## 2020-05-20 LAB — TROPONIN I (HIGH SENSITIVITY): Troponin I (High Sensitivity): 414 ng/L (ref <18)

## 2020-05-20 LAB — CBG MONITORING, ED: Glucose-Capillary: 172 mg/dL — ABNORMAL HIGH (ref 70–99)

## 2020-05-20 MED ORDER — EPINEPHRINE 1 MG/10ML IJ SOSY
PREFILLED_SYRINGE | INTRAMUSCULAR | Status: AC | PRN
  Administered 2020-05-20 (×5): 1 mg via INTRAVENOUS

## 2020-05-20 MED ORDER — EPINEPHRINE 1 MG/10ML IJ SOSY
PREFILLED_SYRINGE | INTRAMUSCULAR | Status: AC | PRN
  Administered 2020-05-20 (×4): 1 via INTRAVENOUS

## 2020-05-20 MED ORDER — EPINEPHRINE HCL 5 MG/250ML IV SOLN IN NS
0.5000 ug/min | INTRAVENOUS | Status: DC
  Administered 2020-05-20: 1.9 ug/min via INTRAVENOUS
  Filled 2020-05-20: qty 250

## 2020-05-20 MED ORDER — DOCUSATE SODIUM 50 MG/5ML PO LIQD
100.0000 mg | Freq: Two times a day (BID) | ORAL | Status: DC
  Filled 2020-05-20: qty 10

## 2020-05-20 MED ORDER — NOREPINEPHRINE 4 MG/250ML-% IV SOLN
0.0000 ug/min | INTRAVENOUS | Status: DC
  Administered 2020-05-20: 5 ug/min via INTRAVENOUS
  Administered 2020-05-20: 50 ug/min via INTRAVENOUS
  Filled 2020-05-20 (×2): qty 250

## 2020-05-20 MED ORDER — EPINEPHRINE 1 MG/10ML IJ SOSY
PREFILLED_SYRINGE | INTRAMUSCULAR | Status: AC | PRN
  Administered 2020-05-20 (×3): 1 mg via INTRAVENOUS

## 2020-05-20 MED ORDER — SODIUM CHLORIDE 0.9 % IV SOLN
1.0000 g | Freq: Once | INTRAVENOUS | Status: AC
  Administered 2020-05-20: 1 g via INTRAVENOUS
  Filled 2020-05-20: qty 10

## 2020-05-20 MED ORDER — EPINEPHRINE HCL 5 MG/250ML IV SOLN IN NS
0.5000 ug/min | INTRAVENOUS | Status: DC
  Administered 2020-05-20: 0.5 ug/min via INTRAVENOUS
  Filled 2020-05-20: qty 250

## 2020-05-20 MED ORDER — ATROPINE SULFATE 1 MG/ML IJ SOLN
INTRAMUSCULAR | Status: AC | PRN
  Administered 2020-05-20: 1 mg via INTRAVENOUS

## 2020-05-20 MED ORDER — POLYETHYLENE GLYCOL 3350 17 G PO PACK: 17.0000 g | PACK | Freq: Every day | ORAL | Status: DC

## 2020-05-20 MED ORDER — SODIUM CHLORIDE 0.9 % IV SOLN
8.0000 mg/h | INTRAVENOUS | Status: DC
  Filled 2020-05-20: qty 80

## 2020-05-20 MED ORDER — SODIUM CHLORIDE 0.9 % IV SOLN
80.0000 mg | Freq: Once | INTRAVENOUS | Status: DC
  Filled 2020-05-20: qty 80

## 2020-05-20 MED ORDER — PHENYLEPHRINE HCL-NACL 10-0.9 MG/250ML-% IV SOLN
0.0000 ug/min | INTRAVENOUS | Status: DC
  Filled 2020-05-20: qty 250

## 2020-05-20 MED ORDER — SODIUM BICARBONATE 8.4 % IV SOLN
INTRAVENOUS | Status: AC | PRN
  Administered 2020-05-20 (×2): 50 meq via INTRAVENOUS

## 2020-05-20 MED ORDER — SODIUM BICARBONATE 8.4 % IV SOLN
INTRAVENOUS | Status: AC | PRN
  Administered 2020-05-20: 50 meq via INTRAVENOUS

## 2020-05-20 MED ORDER — POLYETHYLENE GLYCOL 3350 17 G PO PACK: 17.0000 g | PACK | Freq: Every day | ORAL | Status: DC | PRN

## 2020-05-20 MED ORDER — CALCIUM CHLORIDE 10 % IV SOLN
INTRAVENOUS | Status: AC | PRN
  Administered 2020-05-20: 1 g via INTRAVENOUS

## 2020-05-20 MED ORDER — DOCUSATE SODIUM 100 MG PO CAPS: 100.0000 mg | ORAL_CAPSULE | Freq: Two times a day (BID) | ORAL | Status: DC | PRN

## 2020-05-20 MED ORDER — VASOPRESSIN 20 UNITS/100 ML INFUSION FOR SHOCK
0.0000 [IU]/min | INTRAVENOUS | Status: DC
  Administered 2020-05-20: 0.03 [IU]/min via INTRAVENOUS
  Filled 2020-05-20: qty 100

## 2020-05-21 LAB — URINE CULTURE: Culture: NO GROWTH

## 2020-05-29 NOTE — H&P (Signed)
NAME:  Susan Cole, MRN:  [id number], DOB:  Aug 05, 1945, LOS: 0 ADMISSION DATE:  May 23, 2020, CONSULTATION DATE:  05/23/20 REFERRING MD:  Dr. Darnelle Catalan, CHIEF COMPLAINT:  Cardiac Arrest   Brief History   74 year old female presenting to the ED via EMS status post respiratory arrest leading to cardiac arrest with acute hypoxic respiratory failure, intubated and mechanically ventilated and admitted to the ICU.  History of present illness   74 year old female presenting to the ED from home via EMS.  Husband called EMS due to respiratory distress. When EMS arrived they reported the patient to be in respiratory arrest but had difficulty moving the patient out of the house onto with the stretcher due to obstructions.  During her removal the patient lost consciousness and became pulseless, CPR was started.  EMS reported 3 total pulseless events requiring CPR before achieving ROSC no shock delivered during any of these events.  Upon arrival to the ED pupils were fixed and dilated, but she had received 3 A of atropine in the field.  Patient became pulseless and asystole after arrival to the ED, requiring 1 more round of CPR and epinephrine IV push. Dr. Kirke Corin evaluated the patient to see if a cath was appropriate.  Since no clear ST elevation on EKG, prolonged CPR, asystole with no evidence of ventricular arrhythmia cardiac catheterization is not recommended by cardiology.  Patient was intubated on arrival to the emergency department requiring mechanical ventilation.  PCCM consulted for further management and admission to the ICU.  Past Medical History  MI  Significant Hospital Events     Consults:    Procedures:  05/23/20 ETT >> 05/23/2020 CVC R IJ 3 L >>  Significant Diagnostic Tests:    Micro Data:    Antimicrobials:     Interim history/subjective:  Patient intubated with pupils fixed and dilated, GCS 3, RASS -5. Not eligible for CODE ICE/ TTM due acute GIB.  Labs/ Imaging personally  reviewed- labs pending Objective   Blood pressure 139/79, pulse (!) 121, temperature 98.5 F (36.9 C), resp. rate (!) 22, SpO2 93 %.    Vent Mode: AC FiO2 (%):  [100 %] 100 % Set Rate:  [20 bmp] 20 bmp Vt Set:  [450 mL] 450 mL PEEP:  [8 cmH20] 8 cmH20  No intake or output data in the 24 hours ending 05-23-2020 1426 There were no vitals filed for this visit.  Examination: General: Adult female, critically ill, lying in bed intubated & sedated requiring mechanical ventilation  HEENT: MM pink/moist, anicteric, atraumatic, neck supple Neuro: unresponsive, unable to commands, Pupils +5, non reactive CV: s1s2 ST on monitor, no r/m/g Pulm: Regular, agonal breathing on PRVC: 100%, breath sounds diminished througohut GI: soft, rounded, bs x 4 GU: foley in place with clear yellow urine Skin: abrasion on chest, no rashes/lesions noted Extremities: warm/dry, pulses + 2 R/P, no edema noted  Resolved Hospital Problem list     Assessment & Plan:  Acute hypoxic respiratory failure due to suspected community-acquired pneumonia - Ventilator settings: AC  8 mL/kg, 100% FiO2, 8 PEEP, continue ventilator support & lung protective strategies - Wean PEEP & FiO2 as tolerated, maintain SpO2 > 90% - Head of bed elevated 30 degrees, VAP protocol in place - Plateau pressures less than 30 cm H20  - Intermittent chest x-ray & ABG PRN - Daily WUA with SBT as tolerated  - Ensure adequate pulmonary hygiene  - F/u cultures, trend PCT - PAD protocol in place: Fentanyl & Propofol PRN -CTA  pending to rule out pulmonary embolus  Severe sepsis with septic shock secondary to suspected community-acquired pneumonia Patiently currently on epinephrine and Levophed drips for hypotension.  -Vasopressin ordered, goal to transition off epinephrine drip as tolerated -Continue Levophed & vasopressin as needed to maintain MAP greater than 65 -Consider adding phenylephrine if needed -Monitor WBC/fever curve, follow-up  cultures, trend lactic and PCT -Daily BMP replace electrolytes as needed -Plan to order ceftriaxone for CAP coverage  Acute GI bleed Suspected DIC in the setting of severe sepsis Frank red blood noted in the patient's NG tube.   - Initiated Protonix drip - will consult gastroenterology - Monitor for s/s of bleeding - Daily CBC - Transfuse for Hgb <7  Best practice:  Diet: NPO Pain/Anxiety/Delirium protocol (if indicated): fentanyl and propofol as needed VAP protocol (if indicated): Initiated DVT prophylaxis: N/A GI prophylaxis: Protonix drip Glucose control: Every 4 monitor Mobility: Bedrest Code Status: Full Family Communication: Husband and sister-in-law updated bedside- 11/22 Disposition: ICU  Labs   CBC: Recent Labs  Lab 05/27/20 1347  WBC 13.3*  HGB 12.9  HCT 44.9  MCV 103.9*  PLT 139*    Basic Metabolic Panel: Recent Labs  Lab 05/13/20 1546  NA 139  K 4.5  CL 101  CO2 30  GLUCOSE 102*  BUN 14  CREATININE 0.84  CALCIUM 9.8   GFR: Estimated Creatinine Clearance: 59.7 mL/min (by C-G formula based on SCr of 0.84 mg/dL). Recent Labs  Lab 2020-05-27 1347  WBC 13.3*    Liver Function Tests: No results for input(s): AST, ALT, ALKPHOS, BILITOT, PROT, ALBUMIN in the last 168 hours. No results for input(s): LIPASE, AMYLASE in the last 168 hours. No results for input(s): AMMONIA in the last 168 hours.  ABG No results found for: PHART, PCO2ART, PO2ART, HCO3, TCO2, ACIDBASEDEF, O2SAT   Coagulation Profile: Recent Labs  Lab 27-May-2020 1347  INR 1.8*    Cardiac Enzymes: No results for input(s): CKTOTAL, CKMB, CKMBINDEX, TROPONINI in the last 168 hours.  HbA1C: No results found for: HGBA1C  CBG: No results for input(s): GLUCAP in the last 168 hours.  Review of Systems:   Unable to assess patient unresponsive intubated status post cardiac arrest  Past Medical History  She,  has a past medical history of Myocardial infarction (HCC) (2012), Urine  incontinence, and UTI (lower urinary tract infection).   Surgical History    Past Surgical History:  Procedure Laterality Date   CARDIAC CATHETERIZATION  2012   TUBAL LIGATION  1985     Social History   reports that she has been smoking cigarettes. She has a 78.00 pack-year smoking history. She has never used smokeless tobacco. She reports that she does not drink alcohol and does not use drugs.   Family History   Her family history includes Cancer (age of onset: 58) in her father; Diabetes in her brother, brother, mother, and sister; Goiter in her mother; Hypertension in her mother; Kidney disease in her brother; Stroke (age of onset: 68) in her mother.   Allergies No Known Allergies   Home Medications  Prior to Admission medications   Medication Sig Start Date End Date Taking? Authorizing Provider  Acetaminophen 500 MG capsule Take 1 capsule by mouth as needed.    [provider]  amLODipine (NORVASC) 5 MG tablet Take 1 tablet (5 mg total) by mouth daily. 05/13/20   Allegra Grana, FNP  aspirin EC 81 MG tablet Take 1 tablet (81 mg total) by mouth daily. 08/09/19  Allegra Grana, FNP  atorvastatin (LIPITOR) 20 MG tablet Take 1 tablet (20 mg total) by mouth daily. 08/18/19   Allegra Grana, FNP  losartan (COZAAR) 100 MG tablet Take 100 mg by mouth daily. 04/26/20   [provider]     Critical care time: 55 minutes       Betsey Holiday, AGACNP-BC Acute Care Nurse Practitioner [facility]   [phone] / [phone] Please see Amion for pager details.

## 2020-05-29 NOTE — ED Notes (Signed)
Transported to morgue at this time.

## 2020-05-29 NOTE — ED Notes (Signed)
Pharmacy called to send EPI infusion

## 2020-05-29 NOTE — Procedures (Signed)
Central Venous Catheter Insertion Procedure Note  Susan Cole  [id number]  10-25-1945  Date:2020/05/22  Time:8:25 PM   Provider Performing:Lousie Calico L [place]   Procedure: Insertion of Non-tunneled Central Venous Catheter(36556) with US guidance (78938)   Indication(s) Medication administration  Consent Unable to obtain consent due to emergent nature of procedure.  Anesthesia Topical only with 1% lidocaine   Timeout Verified patient identification, verified procedure, site/side was marked, verified correct patient position, special equipment/implants available, medications/allergies/relevant history reviewed, required imaging and test results available.  Sterile Technique Maximal sterile technique including full sterile barrier drape, hand hygiene, sterile gown, sterile gloves, mask, hair covering, sterile ultrasound probe cover (if used).  Procedure Description Area of catheter insertion was cleaned with chlorhexidine and draped in sterile fashion.  With real-time ultrasound guidance a central venous catheter was placed into the right internal jugular vein. Nonpulsatile blood flow and easy flushing noted in all ports.  The catheter was sutured in place and sterile dressing applied.  Complications/Tolerance None; patient tolerated the procedure well. Chest X-ray is ordered to verify placement for internal jugular or subclavian cannulation.   Chest x-ray is not ordered for femoral cannulation.  EBL Minimal  Specimen(s) None  Betsey Holiday, AGACNP-BC Acute Care Nurse Practitioner [facility]   [phone] / [phone] Please see Amion for pager details.

## 2020-05-29 NOTE — ED Notes (Addendum)
Pt has one denture and grey colored ring with green/blue stone on lt index finger that remains with body. Eye care performed at this time.

## 2020-05-29 NOTE — Code Documentation (Signed)
Pt remains in PEA. CPR continues.

## 2020-05-29 NOTE — Code Documentation (Signed)
Family at beside. Family given emotional support. 

## 2020-05-29 NOTE — ED Notes (Signed)
Denise with [employer] states pt is a candidate for eyes only for donation.

## 2020-05-29 NOTE — ED Notes (Addendum)
Pulse check. Pulses palpable.  

## 2020-05-29 NOTE — Code Documentation (Signed)
Pt noted to be asystole in pulse check. Per Dr. Darnelle Catalan code called at this time.

## 2020-05-29 NOTE — ED Notes (Signed)
ICU MD at bedside

## 2020-05-29 NOTE — Progress Notes (Signed)
Notified by Dr. Darnelle Catalan regarding this patient to see if urgent cardiac catheterization is indicated.  She had respiratory distress at home and by the time EMS arrived, she was noted to be asystolic.  CPR was performed for about 30 minutes and then again in the ED for another 20 minutes approximately.  The patient had no ventricular arrhythmia and did not require a shock.  EKG with diffuse ST depression without ST elevation.  NG tube with significant bleeding.  Recommendations: Given no clear ST elevation on EKG, prolonged CPR, asystole with no evidence of ventricular arrhythmia, I do not recommend urgent cardiac catheterization as the diagnostic yield here is very low.  Recommend supportive care. Overall prognosis seems to be poor. If full cardiology consultation is needed, please consult Dr. Gwen Pounds who is the primary cardiologist.

## 2020-05-29 NOTE — ED Notes (Signed)
Attempted report, was told "Prepare to be the bedside nurse because we can't take her".

## 2020-05-29 NOTE — Code Documentation (Signed)
Patient time of death occurred at 1613. 

## 2020-05-29 NOTE — ED Notes (Signed)
Charge RN, Erie Noe spoke at length with pt's family. DSS has been involved.

## 2020-05-29 NOTE — ED Notes (Signed)
Pulse check. Pulse palpable

## 2020-05-29 NOTE — ED Triage Notes (Addendum)
Pt to ED via [facility] from home. Per EMS initial call out for breathing trouble that started PTA. Upon arrival pt went unresponsive, apneic and pulseless. 2 atropin and 3 epi given in route. Pt asystole with EMS.   Upon arrival pt asystole with lucas in place.

## 2020-05-29 NOTE — ED Provider Notes (Signed)
Called by ICU NP to help assist in [place] as patient lost her pulse.  CPR was restarted immediately and patient was given epinephrine.  Patient then had epi drip restarted and 2 L of warm saline were infused via a central line as patient was presumed to be infected and there was some worry that the patient may be septic even though she was not running a fever.Marland Kitchen  Epi was continued 1 amp every 2 to 3 minutes.  1 amp of bicarb was given IV.  1 amp of calcium was given as patient's calcium was slightly low.  There was no response no return of pulse in spite of vigorous CPR.  Dr. Jayme Cloud came back down and after half an hour with no return of pulse and no respiratory effort code was called.   Arnaldo Natal, MD 06/07/2020 1620

## 2020-05-29 NOTE — ED Notes (Signed)
Rt IV appears not to be working, and noted the Lt IO to be leaking. Providers at bedside and aware, pt prepped for emergent central line placement.

## 2020-05-29 NOTE — Progress Notes (Signed)
   23-May-2020 1800  Clinical Encounter Type  Visited With Family;Health care provider  Visit Type Initial;Spiritual support;Death  Referral From Nurse;Physician  Recommendations  (Provide additional resources for grief support)  Spiritual Encounters  Spiritual Needs Prayer;Emotional;Grief support  Stress Factors  Family Stress Factors Lack of caregivers;Loss;Major life changes   This chaplain received a referral to support the husband and family of the patient in the wake of her sudden and traumatic passing. Upon arrival, the patient's husband, and sister-in-law and brother-in-law were met in the ED Family Waiting room. The family was understandably upset and grieving appropriately. The patient's husband lamented their difficult times and the grief he is feeling over the loss of his wife of 26 years. 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 This chaplain escorted the patient's family to be with her at the bedside and offered prayer, words of comfort, compassionate ministerial presence, active and reflective listening, and emotional support. The family expressed gratitude for the support offered.  Follow-up call placed to the patient's sister-in-law to provide contact information for grief counseling through [employer].   Gennaro Africa, Chaplain

## 2020-05-29 NOTE — ED Provider Notes (Addendum)
[facility] Emergency Department Provider Note   ____________________________________________   First MD Initiated Contact with Patient 06/03/20 1425     (approximate)  I have reviewed the triage vital signs and the nursing notes.   HISTORY  Chief Complaint No chief complaint on file.  Chief complaint cardiopulmonary arrest  HPI Susan Cole is a 74 y.o. female whose husband apparently called EMS for respiratory distress.  When EMS arrived patient was in respiratory distress fever attempting to move her out of the house on the stretcher around some obstructions when she lost consciousness.  They began CPR.  As she received several doses of atropine and epinephrine had return of pulse twice.  On arrival in the emergency room she was pulseless and apneic with a [name] LT in place.  Lucas device was providing CPR.  Lucas device was repositioned and then the battery stopped our resume manual CPR patient received more doses of epinephrine.  King airway was removed ET tube was placed under direct vision by me patient experienced return of circulation with good pulse and blood pressure norepinephrine was started unfortunately pulse was lost again and we resumed CPR gave her one more dose of epinephrine pulse was recovered started on epi drip along with the norepinephrine patient is now making swallowing motions and respiratory effort but is otherwise unresponsive.  Pupils are fixed and dilated but she has had 3 Amps of atropine.  Patient also has had at least 2 Amps of bicarb.  Dr. Kirke Corin came down and evaluated the patient with the EKGs felt in because of several episodes of loss of pulse no V. fib or V. tach she is not a candidate for the Cath Lab at present.  Dr. Jayme Cloud pulmonary also came down and took over management of the patient she does not feel the patient is a candidate for Covid ice.  We will get a head CT and CT chest to make sure there is no pulmonary embolus.   Patient has no known fever no apparent coughing.         Past Medical History:  Diagnosis Date  . Myocardial infarction [facility]) 2012   [facility] seen by Dr. Gwen Pounds  . Urine incontinence   . UTI (lower urinary tract infection)     Patient Active Problem List   Diagnosis Date Noted  . Acute respiratory failure (HCC) 06-03-2020  . Atherosclerosis of aorta (HCC) 08/23/2019  . Thyroid nodule 08/23/2019  . CAD (coronary artery disease) 08/16/2019  . Chest pain 08/07/2019  . Encounter to establish care 08/07/2019  . Knee pain, right 01/12/2015  . Medicare annual wellness visit, subsequent 04/19/2013  . HTN (hypertension) 03/08/2013  . Urinary incontinence 03/08/2013  . Tobacco abuse counseling 03/08/2013    Past Surgical History:  Procedure Laterality Date  . CARDIAC CATHETERIZATION  2012  . TUBAL LIGATION  1985    Prior to Admission medications   Medication Sig Start Date End Date Taking? Authorizing Provider  Acetaminophen 500 MG capsule Take 1 capsule by mouth as needed.    [provider]  amLODipine (NORVASC) 5 MG tablet Take 1 tablet (5 mg total) by mouth daily. 05/13/20   Allegra Grana, FNP  aspirin EC 81 MG tablet Take 1 tablet (81 mg total) by mouth daily. 08/09/19   Allegra Grana, FNP  atorvastatin (LIPITOR) 20 MG tablet Take 1 tablet (20 mg total) by mouth daily. 08/18/19   Allegra Grana, FNP  losartan (COZAAR) 100 MG tablet  Take 100 mg by mouth daily. 04/26/20   [provider]    Allergies Patient has no known allergies.  Family History  Problem Relation Age of Onset  . Stroke Mother 63  . Hypertension Mother   . Diabetes Mother   . Goiter Mother   . Cancer Father 55       Lung CA  . Diabetes Sister   . Diabetes Brother   . Diabetes Brother   . Kidney disease Brother     Social History Social History   Tobacco Use  . Smoking status: Current Every Day Smoker    Packs/day: 1.50    Years: 52.00    Pack years: 78.00     Types: Cigarettes  . Smokeless tobacco: Never Used  Substance Use Topics  . Alcohol use: No    Alcohol/week: 0.0 standard drinks  . Drug use: No    Review of Systems Unable to obtain due to cardiac arrest ____________________________________________   PHYSICAL EXAM:  VITAL SIGNS: ED Triage Vitals  Enc Vitals Group     BP 03-Jun-2020 1345 (!) 143/62     Pulse Rate 06-03-2020 1345 77     Resp 06-03-20 1345 (!) 25     Temp Jun 03, 2020 1353 (!) 96.1 F (35.6 C)     Temp src --      SpO2 06/03/2020 1345 100 %     Weight --      Height --      Head Circumference --      Peak Flow --      Pain Score --      Pain Loc --      Pain Edu? --      Excl. in GC? --    Constitutional: Unresponsive Eyes: Conjunctivae are normal.  Pupils are equal round fixed and dilated but as mentioned above she has had 3 mg of atropine IV Head: Atraumatic. Nose: No congestion/rhinnorhea. Mouth/Throat: Mucous membranes are moist.  Oropharynx non-erythematous. Neck: No stridor. Cardiovascular: Normal rate, regular rhythm. Grossly normal heart sounds.  Good peripheral circulation. Respiratory: Normal respiratory effort.  No retractions. Lungs CTAB. Gastrointestinal: Soft and nontender. No distention. No abdominal bruits.  Musculoskeletal: No lower extremity tenderness nor edema.   Neurologic: Patient making spontaneous respiratory movements and swallowing movements.. Skin: Initially skin was gray and cool now skin is warm, dry and intact. No rash noted.   ____________________________________________   LABS (all labs ordered are listed, but only abnormal results are displayed)  Labs Reviewed  CBC - Abnormal; Notable for the following components:      Result Value   WBC 13.3 (*)    MCV 103.9 (*)    MCHC 28.7 (*)    Platelets 139 (*)    nRBC 0.7 (*)    All other components within normal limits  COMPREHENSIVE METABOLIC PANEL - Abnormal; Notable for the following components:   CO2 16 (*)    Glucose,  Bld 390 (*)    Creatinine, Ser 1.54 (*)    Calcium 8.7 (*)    Total Protein 5.8 (*)    Albumin 2.8 (*)    AST 79 (*)    ALT 65 (*)    GFR, Estimated 36 (*)    Anion gap 25 (*)    All other components within normal limits  PROTIME-INR - Abnormal; Notable for the following components:   Prothrombin Time 20.5 (*)    INR 1.8 (*)    All other components within normal limits  APTT - Abnormal; Notable for the following components:   aPTT 73 (*)    All other components within normal limits  TROPONIN I (HIGH SENSITIVITY) - Abnormal; Notable for the following components:   Troponin I (High Sensitivity) 414 (*)    All other components within normal limits  CULTURE, BLOOD (SINGLE)  URINE CULTURE  RESP PANEL BY RT-PCR (FLU A&B, COVID) ARPGX2  CK  LACTIC ACID, PLASMA  LACTIC ACID, PLASMA  URINALYSIS, COMPLETE (UACMP) WITH MICROSCOPIC  PROCALCITONIN   ____________________________________________  EKG  EKG #1 read interpreted by me shows sinus tachycardia at 134 ST segment depression in one and L and V45 and six.  There may be some ST segment elevation in lead III only and also possibly an aVR and V1 but only 1 mm in each of those. EKG #2 read interpreted by me shows sinus tach at a rate of 134 normal axis diffuse ST segment depression throughout this was reviewed with Dr. Jeralene Huff.  There is 1 mm ST elevation in lead V1 and aVR only.  We are attempting to get posterior leads.  Dr. Kirke Corin reviewed these EKGs he does not think there are any D Winter T waves present.  ____________________________________________  RADIOLOGY Jill Poling, personally viewed and evaluated these images (plain radiographs) as part of my medical decision making, as well as reviewing the written report by the radiologist.  ED MD interpretation: Chest x-ray read by radiology reviewed by me shows right upper lobe opacity possible pneumonia the whole right lobe in my opinion is somewhat denser in terms of markings on  the left lobe.  Official radiology report(s): DG Chest Port 1 View  Result Date: May 24, 2020 CLINICAL DATA:  Sepsis. EXAM: PORTABLE CHEST 1 VIEW COMPARISON:  August 07, 2019. FINDINGS: The heart size and mediastinal contours are within normal limits. Endotracheal tube is in good position. No pneumothorax or pleural effusion is noted. Left lung is clear. Right upper lobe airspace opacity is noted concerning for possible pneumonia. The visualized skeletal structures are unremarkable. IMPRESSION: Right upper lobe airspace opacity is noted concerning for possible pneumonia. Endotracheal tube in good position. Electronically Signed   By: Lupita Raider M.D.   On: May 24, 2020 14:01    ____________________________________________   PROCEDURES  Procedure(s) performed (including Critical Care): Patient intubated by me under direct vision using a #7 ET tube.  Cook cords were visualized ET tube was advanced through the cords patient tolerated this very well this was done while patient was unconscious and unresponsive I did not use any sedation or major muscle relaxant. Local care time 1-1/2 hours most of this was spent at the patient's bedside Procedures  I also spoke to the patient's husband on the phone.  Let him know that she is very seriously ill and I am not sure that she will live through this.  Patient's husband said she was sitting in chair and seemed like she stopped breathing briefly started again and stopped again later.  She was not apparently having a cough or fever. ____________________________________________   INITIAL IMPRESSION / ASSESSMENT AND PLAN / ED COURSE  Patient with initial respiratory distress and then cardiopulmonary arrest.  Patient may have had a pneumonia or possibly even a large PE or several PEs.  We will get a chest CT to help definitively answer this question.  Head CT is also been ordered.  Patient is getting antibiotics already.  ____________________________________________   FINAL CLINICAL IMPRESSION(S) / ED DIAGNOSES  Final diagnoses:  Cardiac arrest [facility]HCC)  Successful cardiopulmonary resuscitation  Community acquired pneumonia of right upper lobe of lung     ED Discharge Orders    None      *Please note:  Susan Cole was evaluated in Emergency Department on 01-30-20 for the symptoms described in the history of present illness. She was evaluated in the context of the global COVID-19 pandemic, which necessitated consideration that the patient might be at risk for infection with the SARS-CoV-2 virus that causes COVID-19. Institutional protocols and algorithms that pertain to the evaluation of patients at risk for COVID-19 are in a state of rapid change based on information released by regulatory bodies including the CDC and federal and state organizations. These policies and algorithms were followed during the patient's care in the ED.  Some ED evaluations and interventions may be delayed as a result of limited staffing during and the pandemic.*   Note:  This document was prepared using Dragon voice recognition software and may include unintentional dictation errors.    Arnaldo NatalMalinda, Raeya Merritts F, MD 2019/10/13 1457    Arnaldo NatalMalinda, Ethal Gotay F, MD 2019/10/13 [phone]

## 2020-05-29 NOTE — ED Notes (Signed)
Pulse check. No pulse palpable

## 2020-05-29 NOTE — Death Summary Note (Signed)
DEATH SUMMARY   Patient Details  Name: Susan Cole MRN: [id number] DOB: [birth year]  Admission/Discharge Information   Admit Date:  06/08/2020  Date of Death: Date of Death: 06/08/20  Time of Death: Time of Death: 11-03-11  Length of Stay: 0  Referring Physician: Allegra Grana, FNP   Reason(s) for Hospitalization  Acute hypoxic respiratory failure and cardiac arrest  Diagnoses  Preliminary cause of death:  Secondary Diagnoses (including complications and co-morbidities):  Active Problems:   Acute respiratory failure [facility])   Brief Hospital Course (including significant findings, care, treatment, and services provided and events leading to death)  Susan Cole is a 74 y.o. year old female who presenting to the ED from home via EMS.  Husband called EMS due to respiratory distress. When EMS arrived they reported the patient to be in respiratory arrest but had difficulty moving the patient out of the house onto with the stretcher due to obstructions.  During her removal the patient lost consciousness and became pulseless, CPR was started.  EMS reported 3 total pulseless events requiring CPR before achieving ROSC no shock delivered during any of these events.  Upon arrival to the ED pupils were fixed and dilated, but she had received 3 A of atropine in the field.  Patient became pulseless and asystole after arrival to the ED, requiring 1 more round of CPR and epinephrine IV push. Dr. Kirke Corin evaluated the patient to see if a cath was appropriate.  Since no clear ST elevation on EKG, prolonged CPR, asystole with no evidence of ventricular arrhythmia cardiac catheterization is not recommended by cardiology.  Patient was intubated on arrival to the emergency department requiring mechanical ventilation.   Patient not eligible for targeted temperature management due to acute GI bleed. Patient continued to require multiple vasopressors to maintain hemodynamic stability.  At 3:45 PM patient went  into PEA arrest, requiring CPR, multiple rounds of epi given as well as calcium & sodium bicarbonate.  CPR continued until 4:13 PM when time of death was called.  Pertinent Labs and Studies  Significant Diagnostic Studies DG Abdomen 1 View  Result Date: 06-08-20 CLINICAL DATA:  Check gastric catheter placement EXAM: ABDOMEN - 1 VIEW COMPARISON:  None. FINDINGS: Gastric catheter is noted with the tip in the stomach. Proximal side port lies in the distal esophagus and should be advanced several cm. No obstructive changes are seen. No free air is noted. IMPRESSION: Gastric catheter as described.  This should be advanced several cm. Electronically Signed   By: Alcide Clever M.D.   On: 06/08/20 16:04   DG Chest Portable 1 View  Result Date: 2020/06/08 CLINICAL DATA:  Check central line placement EXAM: PORTABLE CHEST 1 VIEW COMPARISON:  Film from earlier in the same day. FINDINGS: Endotracheal tube and right jugular central line are now seen in satisfactory position. The lungs are well aerated bilaterally. No pneumothorax is noted on the right. Diffuse airspace opacity remains in the right upper lobe stable from the prior exam. Left lung remains clear. No bony abnormality is noted. Gastric catheter is noted extending into the stomach although the proximal side port lies in the distal esophagus and should be advanced several cm. IMPRESSION: Persistent right upper lobe pneumonia. Tubes and lines as described. Gastric catheter should be advanced. No pneumothorax is noted. Electronically Signed   By: Alcide Clever M.D.   On: 06-08-20 16:03   DG Chest Port 1 View  Result Date: 06-08-2020 CLINICAL DATA:  Sepsis. EXAM: PORTABLE CHEST  1 VIEW COMPARISON:  August 07, 2019. FINDINGS: The heart size and mediastinal contours are within normal limits. Endotracheal tube is in good position. No pneumothorax or pleural effusion is noted. Left lung is clear. Right upper lobe airspace opacity is noted concerning for  possible pneumonia. The visualized skeletal structures are unremarkable. IMPRESSION: Right upper lobe airspace opacity is noted concerning for possible pneumonia. Endotracheal tube in good position. Electronically Signed   By: Lupita Raider M.D.   On: 13-Jun-2020 14:01    Microbiology Recent Results (from the past 240 hour(s))  Resp Panel by RT-PCR (Flu A&B, Covid) Nasopharyngeal Swab     Status: None   Collection Time: Jun 13, 2020  1:47 PM   Specimen: Nasopharyngeal Swab; Nasopharyngeal(NP) swabs in vial transport medium  Result Value Ref Range Status   SARS Coronavirus 2 by RT PCR NEGATIVE NEGATIVE Final    Comment: (NOTE) SARS-CoV-2 target nucleic acids are NOT DETECTED.  The SARS-CoV-2 RNA is generally detectable in upper respiratory specimens during the acute phase of infection. The lowest concentration of SARS-CoV-2 viral copies this assay can detect is 138 copies/mL. A negative result does not preclude SARS-Cov-2 infection and should not be used as the sole basis for treatment or other patient management decisions. A negative result may occur with  improper specimen collection/handling, submission of specimen other than nasopharyngeal swab, presence of viral mutation(s) within the areas targeted by this assay, and inadequate number of viral copies(<138 copies/mL). A negative result must be combined with clinical observations, patient history, and epidemiological information. The expected result is Negative.  Fact Sheet for Patients:  BloggerCourse.com  Fact Sheet for Healthcare Providers:  SeriousBroker.it  This test is no t yet approved or cleared by the [place] FDA and  has been authorized for detection and/or diagnosis of SARS-CoV-2 by FDA under an Emergency Use Authorization (EUA). This EUA will remain  in effect (meaning this test can be used) for the duration of the COVID-19 declaration under Section 564(b)(1) of  the Act, 21 U.S.C.section 360bbb-3(b)(1), unless the authorization is terminated  or revoked sooner.       Influenza A by PCR NEGATIVE NEGATIVE Final   Influenza B by PCR NEGATIVE NEGATIVE Final    Comment: (NOTE) The Xpert Xpress SARS-CoV-2/FLU/RSV plus assay is intended as an aid in the diagnosis of influenza from Nasopharyngeal swab specimens and should not be used as a sole basis for treatment. Nasal washings and aspirates are unacceptable for Xpert Xpress SARS-CoV-2/FLU/RSV testing.  Fact Sheet for Patients: BloggerCourse.com  Fact Sheet for Healthcare Providers: SeriousBroker.it  This test is not yet approved or cleared by the [place] FDA and has been authorized for detection and/or diagnosis of SARS-CoV-2 by FDA under an Emergency Use Authorization (EUA). This EUA will remain in effect (meaning this test can be used) for the duration of the COVID-19 declaration under Section 564(b)(1) of the Act, 21 U.S.C. section 360bbb-3(b)(1), unless the authorization is terminated or revoked.  Performed at [facility], [address] [place], [place] [address]     Lab Basic Metabolic Panel: Recent Labs  Lab June 13, 2020 1347  NA 142  K 3.5  CL 101  CO2 16*  GLUCOSE 390*  BUN 15  CREATININE 1.54*  CALCIUM 8.7*   Liver Function Tests: Recent Labs  Lab 2020-06-13 1347  AST 79*  ALT 65*  ALKPHOS 65  BILITOT 0.8  PROT 5.8*  ALBUMIN 2.8*   No results for input(s): LIPASE, AMYLASE in the last 168 hours.  No results for input(s): AMMONIA in the last 168 hours. CBC: Recent Labs  Lab 2020-06-19 1347  WBC 13.3*  HGB 12.9  HCT 44.9  MCV 103.9*  PLT 139*   Cardiac Enzymes: Recent Labs  Lab Jun 19, 2020 1347  CKTOTAL 119   Sepsis Labs: Recent Labs  Lab June 19, 2020 1347  WBC 13.3*    Procedures/Operations  06-19-20 ETT  19-Jun-2020 CVC R IJ 3 L   Cheryll Cockayne Rust-Chester, AGACNP-BC Acute Care Nurse  Practitioner [facility]   [phone] / [phone] Please see Amion for pager details.    Darivs Lunden L Rust-Chester Jun 19, 2020, 8:27 PM

## 2020-05-29 NOTE — ED Notes (Signed)
Pulse check.No pulse. CPR initiated

## 2020-05-29 NOTE — ED Notes (Signed)
Provider attempting Rt IJ at this time.

## 2020-05-29 NOTE — ED Notes (Signed)
Received pt via EMS CPR in progress. Has Samuel Bouche on, IO noted to [employer]. Pt appears cyanotic from chest up. Has fecal matter encrusting socks and stuck to bilat feet. Has soiled herself as well. Pt was found in sitting chair at home by EMS, apenic. Per EMS they lost pulses in home and performed CPR in home for approx 30 min. EMS gave 2 of Atropine and 2 EPI prior to arrival.

## 2020-05-29 NOTE — Progress Notes (Signed)
MEDICATION RELATED CONSULT NOTE  Pharmacy Consult for Renal Dosing and Electrolyte Replacement Indication: SOB becoming unresponsive  No Known Allergies   Vital Signs: Temp: 97.2 F (36.2 C) (11/22 1525) BP: 91/67 (11/22 1525) Pulse Rate: 140 (11/22 1525) Intake/Output from previous day: No intake/output data recorded. Intake/Output from this shift: No intake/output data recorded.  Labs: Recent Labs    05-29-20 1347  WBC 13.3*  HGB 12.9  HCT 44.9  PLT 139*  APTT 73*  CREATININE 1.54*  ALBUMIN 2.8*  PROT 5.8*  AST 79*  ALT 65*  ALKPHOS 65  BILITOT 0.8   Estimated Creatinine Clearance: 32.6 mL/min (A) (by C-G formula based on SCr of 1.54 mg/dL (H)).    Medications:  (Not in a hospital admission)  Scheduled:  . docusate  100 mg Per Tube BID  . polyethylene glycol  17 g Per Tube Daily   Infusions:  . epinephrine Stopped (05-29-20 1504)  . norepinephrine (LEVOPHED) Adult infusion 60 mcg/min (05-29-2020 1528)  . pantoprozole (PROTONIX) infusion    . pantoprazole (PROTONIX) 80 mg IVPB    . phenylephrine (NEO-SYNEPHRINE) Adult infusion    . vasopressin 0.03 Units/min (05-29-2020 1504)   PRN: docusate sodium, polyethylene glycol Anti-infectives (From admission, onward)   Start     Dose/Rate Route Frequency Ordered Stop   05/29/20 1415  cefTRIAXone (ROCEPHIN) 1 g in sodium chloride 0.9 % 100 mL IVPB        1 g 200 mL/hr over 30 Minutes Intravenous  Once 29-May-2020 1402 05-29-20 1442      Assessment: Current encounter med list reviewed:  Based on CrCl 32.6, no medication changes are warranted at this time.  Na 142, K 3.5, Ca 8.7, Alb 2.8, Corrected Ca 9.7, Mg N/A.  No electrolyte replacement therapy warranted at this time.  Plan:  Pharmacy will continue to monitor medications and labs and will adjust doses and/or order electrolyte replacement ordered when appropriate  Otelia Sergeant, PharmD, [facility] May 29, 2020 4:20 PM

## 2020-05-29 NOTE — ED Notes (Signed)
Pharmacy called to send vasopressin

## 2020-05-29 NOTE — Code Documentation (Signed)
Pt noted to be brady on monitor, no pulse palpated. CPR started. Provider aware.

## 2020-05-29 NOTE — Code Documentation (Signed)
All providers at bedside, ED and intensivist, pt remains in PEA. CPR continues.

## 2020-05-29 NOTE — ED Notes (Signed)
Provider at bedside attempting Rt femoral central line, having difficulty obtaining line. Remaining at bedside.

## 2020-05-29 DEATH — deceased

## 2020-06-17 ENCOUNTER — Ambulatory Visit: Payer: Medicare Other | Admitting: Family

## 2020-09-04 ENCOUNTER — Ambulatory Visit: Payer: Medicare Other

## 2020-09-05 ENCOUNTER — Telehealth: Payer: Self-pay

## 2020-09-05 NOTE — Telephone Encounter (Signed)
Patient is deceased.

## 2021-01-02 IMAGING — DX DG CHEST 2V
3 series · 3 of 3 positions shown · non-contrast
Comparison: Radiographs 02/26/2010.

CLINICAL DATA: Longstanding smoker. History of hypertension.

EXAM:
CHEST - 2 VIEW

[chest pa]
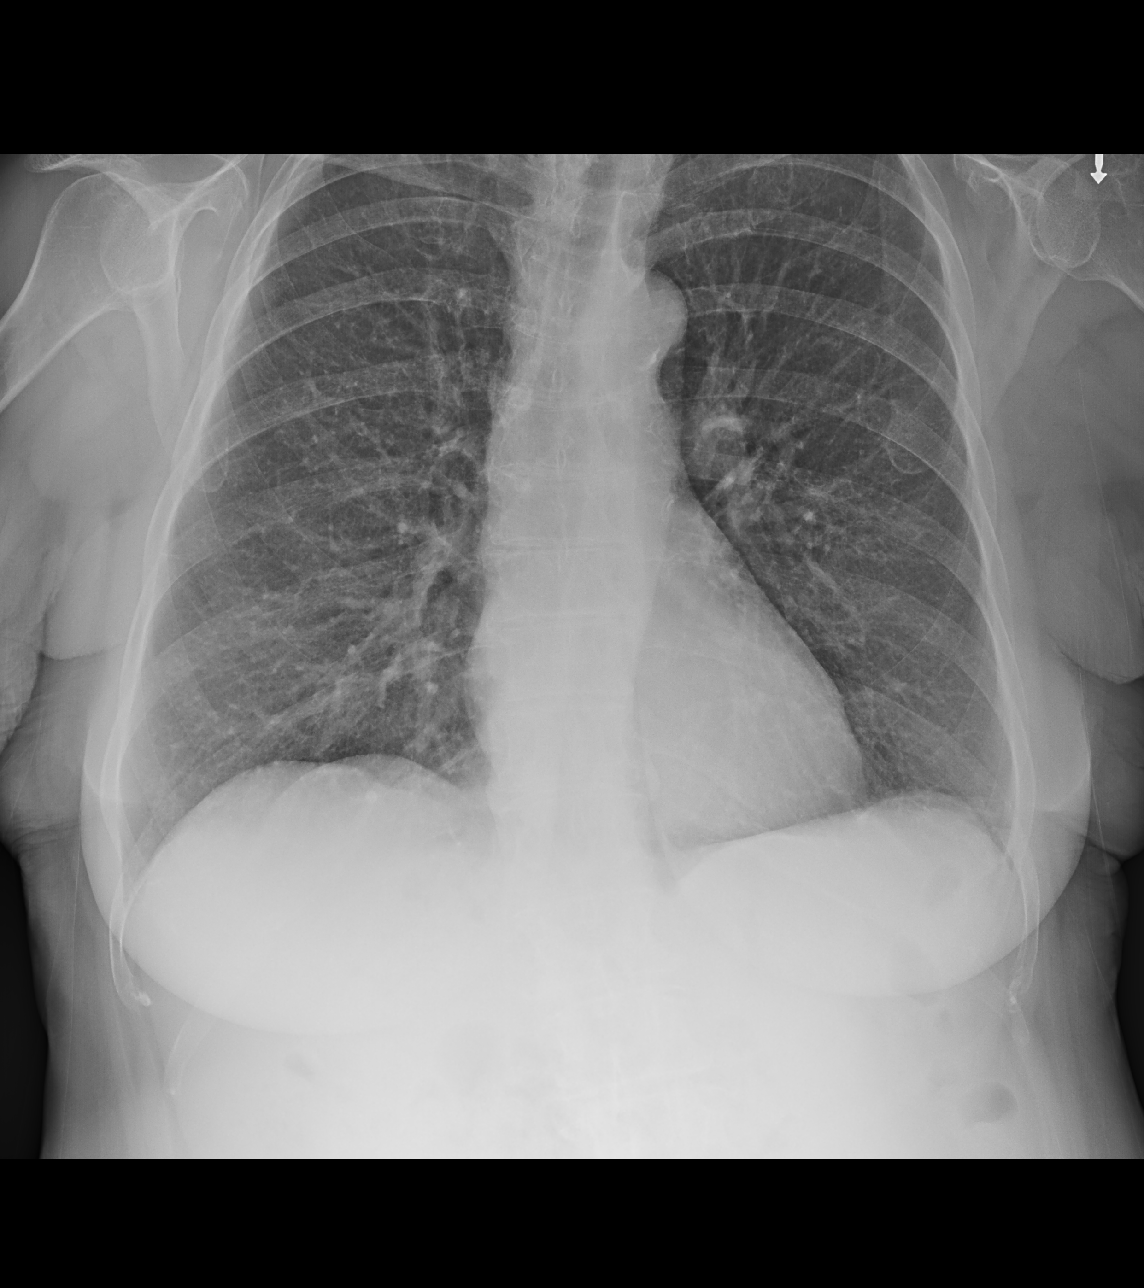

[chest lat (1 of 2)]
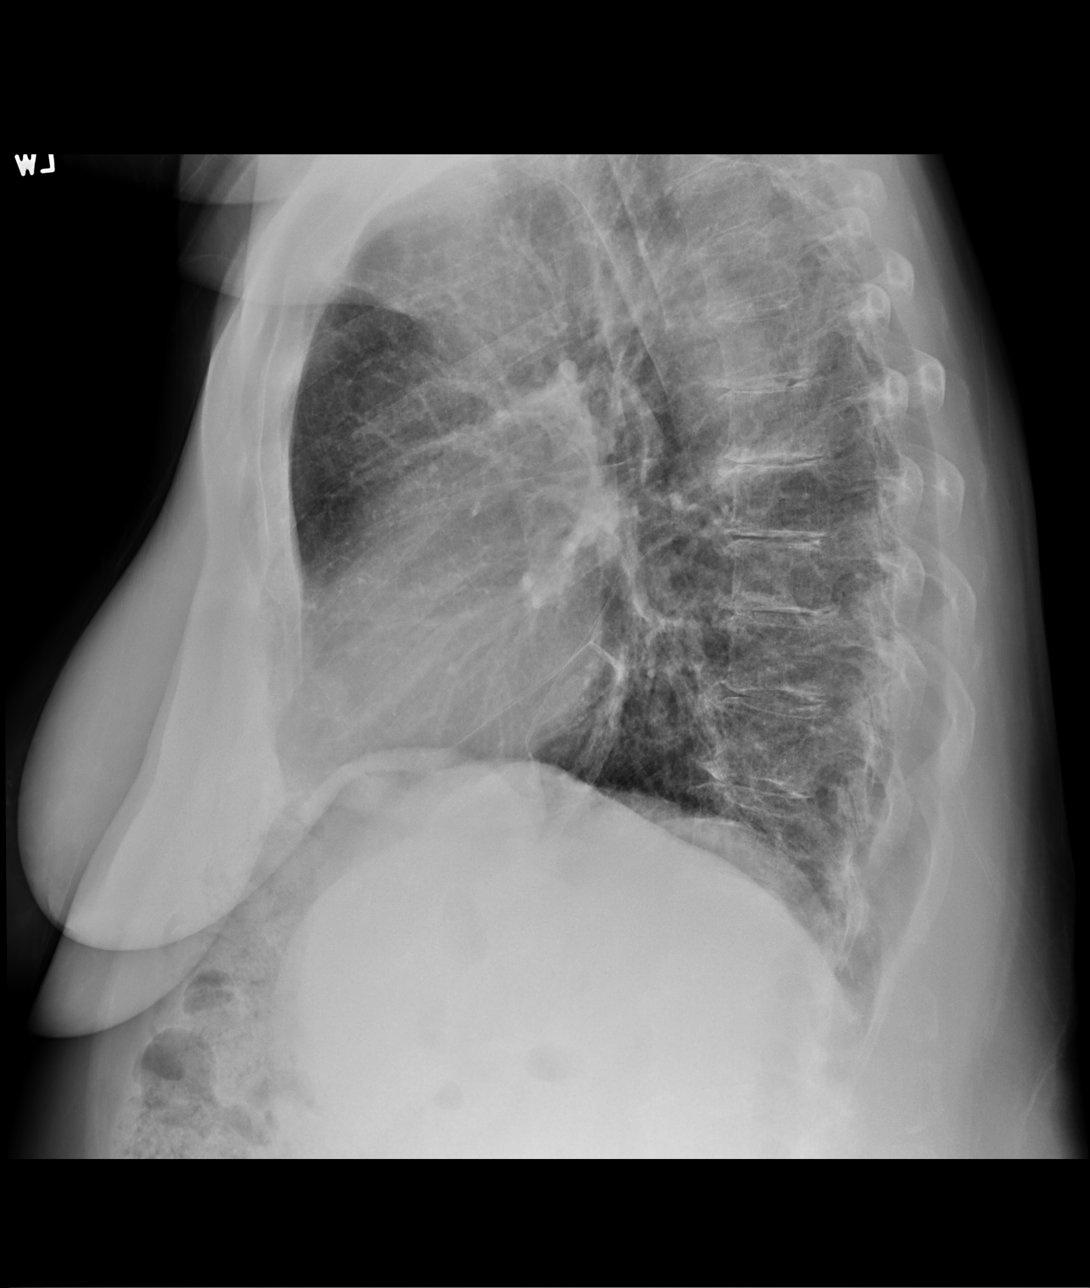

[chest lat (2 of 2)]
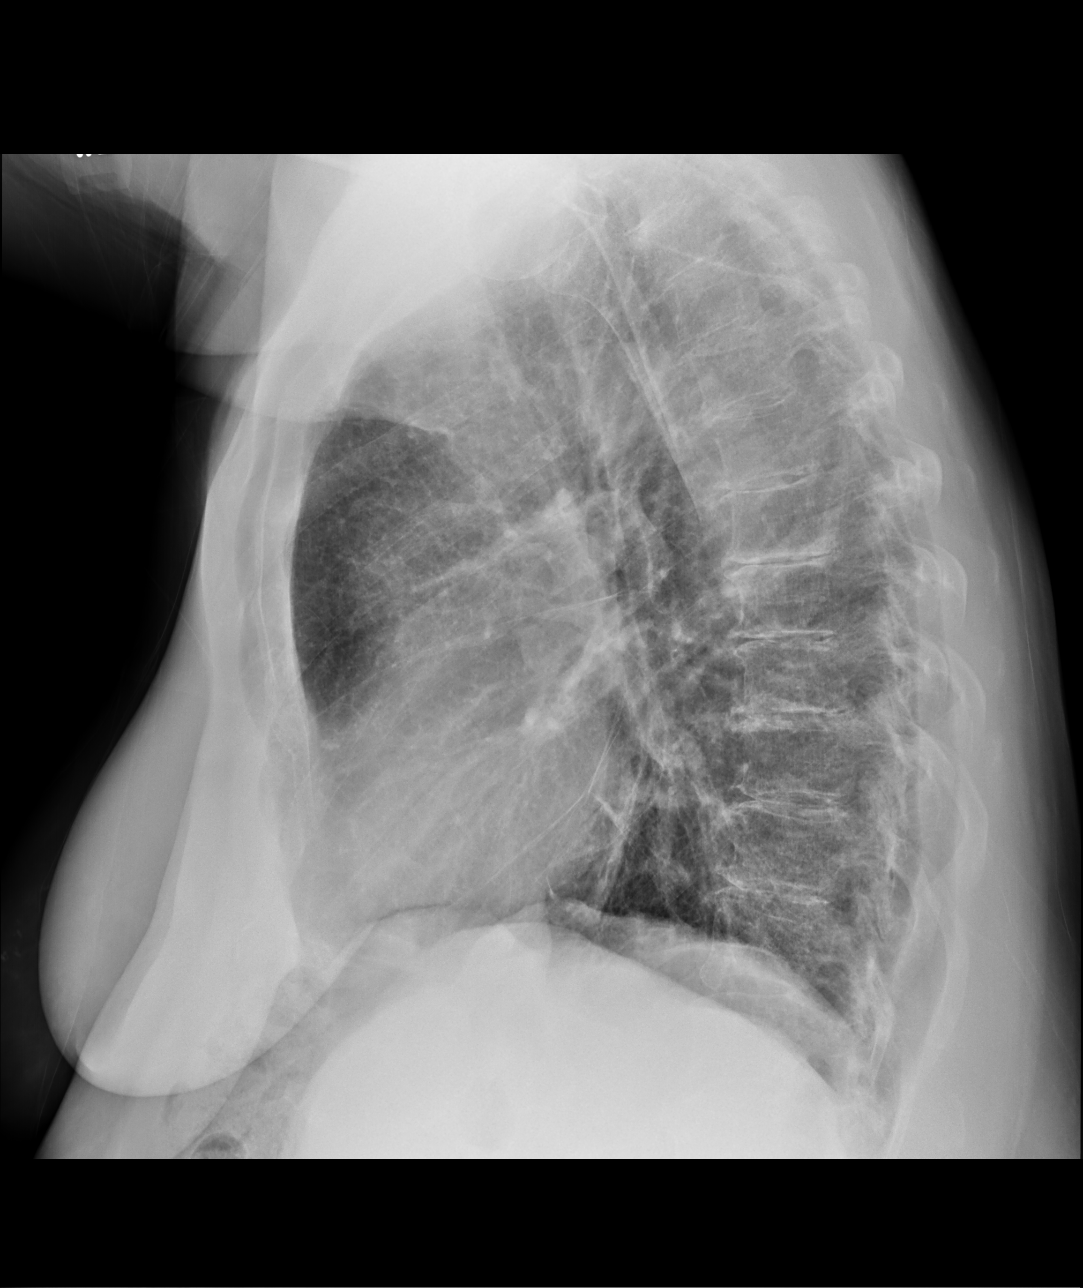

[3 of 3 positions shown; findings below may reference images not displayed]

FINDINGS: The heart size and mediastinal contours are stable. There is mild
central airway thickening and interstitial prominence. No edema,
confluent airspace opacity, pleural effusion or pneumothorax. Mild
degenerative changes are present throughout the spine associated
with a mild thoracolumbar scoliosis.
IMPRESSION: No acute cardiopulmonary process.

## 2021-10-16 IMAGING — DX DG ABDOMEN 1V
1 series · 1 of 1 positions shown · non-contrast
Comparison: None.

CLINICAL DATA: Check gastric catheter placement

EXAM:
ABDOMEN - 1 VIEW

[abdomen supine]
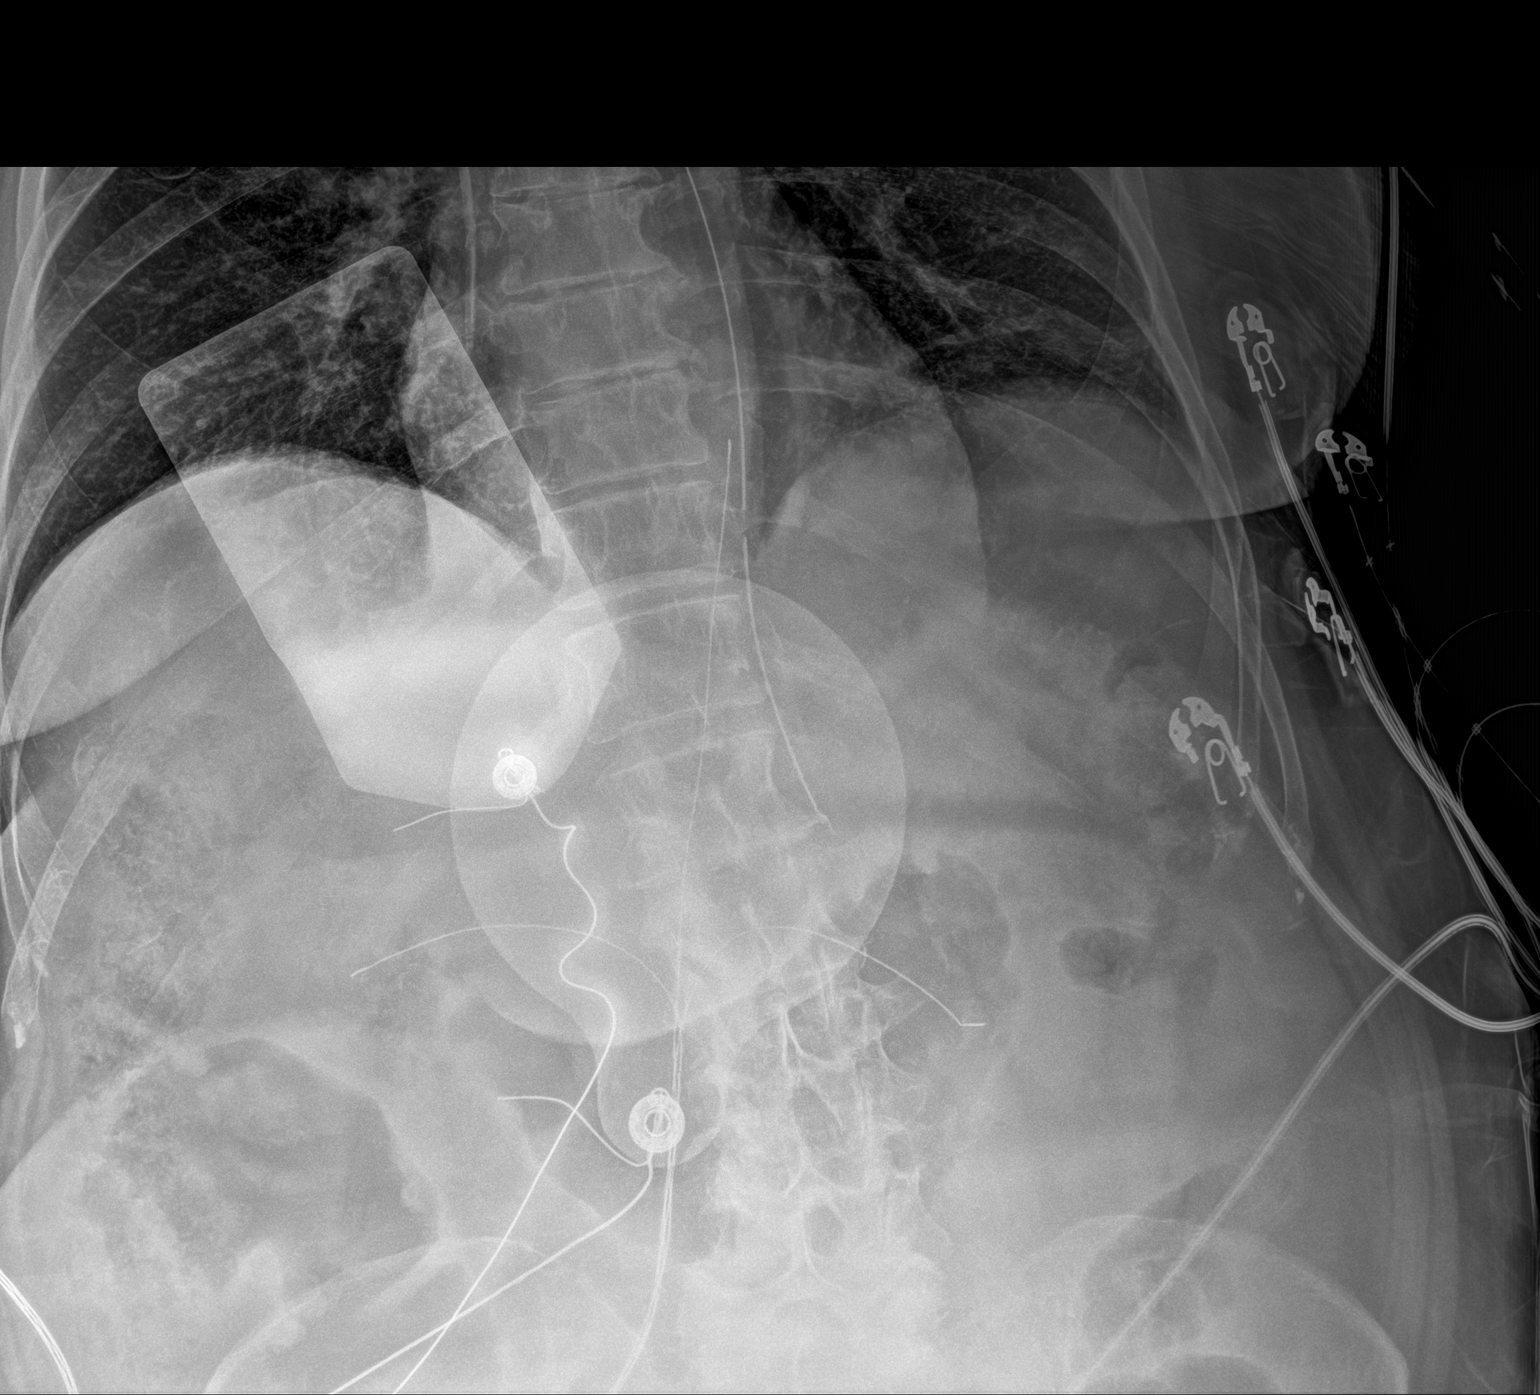

[1 of 1 positions shown; findings below may reference images not displayed]

FINDINGS: Gastric catheter is noted with the tip in the stomach. Proximal side
port lies in the distal esophagus and should be advanced several cm.
No obstructive changes are seen. No free air is noted.
IMPRESSION: Gastric catheter as described.  This should be advanced several cm.
